# Patient Record
Sex: Female | Born: 1985 | Race: White | Hispanic: No | Marital: Married | Smoking: Never smoker
Health system: Southern US, Community
[De-identification: ages and names within clinical notes are randomized; demographics above are authoritative.]

## PROBLEM LIST (undated history)

## (undated) DIAGNOSIS — J45909 Unspecified asthma, uncomplicated: Secondary | ICD-10-CM

## (undated) DIAGNOSIS — T4145XA Adverse effect of unspecified anesthetic, initial encounter: Secondary | ICD-10-CM

## (undated) DIAGNOSIS — T8859XA Other complications of anesthesia, initial encounter: Secondary | ICD-10-CM

## (undated) HISTORY — DX: Unspecified asthma, uncomplicated: J45.909

## (undated) HISTORY — DX: Other complications of anesthesia, initial encounter: T88.59XA

## (undated) HISTORY — PX: TONSILLECTOMY: SUR1361

---
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
## 2015-12-22 DIAGNOSIS — Z8249 Family history of ischemic heart disease and other diseases of the circulatory system: Secondary | ICD-10-CM | POA: Insufficient documentation

## 2019-06-04 ENCOUNTER — Telehealth: Payer: Self-pay | Admitting: Family Medicine

## 2019-06-04 NOTE — Telephone Encounter (Signed)
Attempted to reach patient to inform her of changes made in the office. Left a voice message.

## 2019-06-06 ENCOUNTER — Ambulatory Visit: Payer: Self-pay

## 2019-06-07 NOTE — L&D Delivery Note (Addendum)
OB/GYN Faculty Practice Delivery Note  Sara Cantu is a 34 y.o. [id number] s/p VD at [redacted]w[redacted]d. She was admitted for IOL for hx of LGA infant.   ROM: 3h 76m with clear fluid GBS Status:  Negative/-- (07/09 1144) Maximum Maternal Temperature:  Temp (24hrs), Avg:98.1 F (36.7 C), Min:97.3 F (36.3 C), Max:98.5 F (36.9 C)    Labor Progress: . Patient arrived at 1.5 cm dilation and was induced with FB followed by Pitocin and AROM.   Delivery Date/Time: 01/07/2020 at 2250 Delivery: Called to room and patient was complete and pushing. Head delivered in ROA position. No nuchal cord present. Shoulder and body delivered in usual fashion. Infant with spontaneous cry, placed on mother's abdomen, dried and stimulated. Cord clamped x 2 after 1-minute delay, and cut by FOB. Cord blood drawn. Placenta delivered spontaneously with gentle cord traction. Fundus firm with massage and Pitocin. Labia, perineum, vagina, and cervix inspected with 1st degree perineal laceration repaired with 2 stitches of 3-0 Monocryl.   Placenta: 3v cord, intact Complications: None Lacerations: 1st degree perineal EBL: 66mL Analgesia: Stitched under local anesthesia   Infant: Female Infant  APGAR (1 MIN): 8   APGAR (5 MINS): 9     Weight: pending  Evelena Leyden, DO PGY-1 Family Medicine    Attestation:  I confirm that I have verified the information documented in the resident's note and that I have also personally reperformed the physical exam and all medical decision making activities.   I was gloved and present for entire delivery SVD without incident No difficulty with shoulders Lacerations as listed above Repair of same supervised by me  Aviva Signs, CNM

## 2019-06-20 ENCOUNTER — Ambulatory Visit (INDEPENDENT_AMBULATORY_CARE_PROVIDER_SITE_OTHER): Payer: BLUE CROSS/BLUE SHIELD

## 2019-06-20 ENCOUNTER — Other Ambulatory Visit: Payer: Self-pay

## 2019-06-20 ENCOUNTER — Encounter: Payer: Self-pay | Admitting: Family Medicine

## 2019-06-20 DIAGNOSIS — Z3200 Encounter for pregnancy test, result unknown: Secondary | ICD-10-CM

## 2019-06-20 DIAGNOSIS — Z3201 Encounter for pregnancy test, result positive: Secondary | ICD-10-CM

## 2019-06-20 LAB — POCT PREGNANCY, URINE: Preg Test, Ur: POSITIVE — AB

## 2019-06-20 NOTE — Progress Notes (Signed)
Pt here today for UPT; test result is positive. Results given and dating reviewed with pt. Reports first positive home pregnancy test on 05/21/19. Pt is 10w 3d today based off LMP 04/08/19, EDD 01/13/20.  Reviewed medications and allergies with pt; list of medications safe to take during pregnancy given. Pt reports some nausea, would not like to take any medication for nausea at this time. Pt expresses interest in receiving prenatal care in our office. Front office notified to schedule new OB appts and provide proof of pregnancy letter.   Fleet Contras RN 06/20/19

## 2019-06-21 NOTE — Progress Notes (Signed)
I have reviewed the chart and agree with nursing staff's documentation of this patient's encounter.  Raelyn Mora, CNM 06/21/2019 9:11 PM

## 2019-07-04 ENCOUNTER — Ambulatory Visit (INDEPENDENT_AMBULATORY_CARE_PROVIDER_SITE_OTHER): Payer: BLUE CROSS/BLUE SHIELD | Admitting: *Deleted

## 2019-07-04 ENCOUNTER — Other Ambulatory Visit: Payer: Self-pay

## 2019-07-04 DIAGNOSIS — Z3490 Encounter for supervision of normal pregnancy, unspecified, unspecified trimester: Secondary | ICD-10-CM

## 2019-07-04 NOTE — Patient Instructions (Signed)

## 2019-07-04 NOTE — Progress Notes (Signed)
I connected with  Sara Cantu on 07/04/19 at  3:30 PM EST by telephone and verified that I am speaking with the correct person using two identifiers.   I discussed the limitations, risks, security and privacy concerns of performing an evaluation and management service by telephone and the availability of in person appointments. I also discussed with the patient that there may be a patient responsible charge related to this service. The patient expressed understanding and agreed to proceed.  Explained I am completing her New OB Intake today. We discussed Her EDD and that it is based on  sure LMP . I reviewed her allergies, meds, OB History, Medical /Surgical history, and appropriate screenings. I informed her of [facility] services.  I explained I will send her the Babyscripts app- app sent to her while on phone.  I explained we will send a blood pressure cuff to [facility] once her medicaid is active. I explained  then we will have her take her blood pressure weekly and enter into the app. Explained she will have some visits in office and some virtually. I sent her a  MyChart  Text and she signed up for MyChart  app. I reviewed her new ob  appointment date/ time with her , our location and to wear mask, no visitors.  I explained she will have a pelvic exam, ob bloodwork, hemoglobin a1C, cbg , genetic testing if desired,- she  Is undecided if she  wants a panorama,  And a pap.  I scheduled an [place] at 19 weeks and gave her the appointment. She voices understanding.  Khylon Davies,RN 07/04/2019  3:29 PM

## 2019-07-08 ENCOUNTER — Ambulatory Visit (INDEPENDENT_AMBULATORY_CARE_PROVIDER_SITE_OTHER): Payer: BLUE CROSS/BLUE SHIELD | Admitting: Advanced Practice Midwife

## 2019-07-08 ENCOUNTER — Other Ambulatory Visit (HOSPITAL_COMMUNITY)
Admission: RE | Admit: 2019-07-08 | Discharge: 2019-07-08 | Disposition: A | Discharge status: Home / Self Care | Payer: BLUE CROSS/BLUE SHIELD | Source: Ambulatory Visit | Attending: Advanced Practice Midwife | Admitting: Advanced Practice Midwife

## 2019-07-08 ENCOUNTER — Encounter: Payer: Self-pay | Admitting: Advanced Practice Midwife

## 2019-07-08 ENCOUNTER — Other Ambulatory Visit: Payer: Self-pay

## 2019-07-08 ENCOUNTER — Other Ambulatory Visit: Payer: Self-pay | Admitting: Advanced Practice Midwife

## 2019-07-08 VITALS — BP 103/66 | HR 94 | Wt 164.0 lb

## 2019-07-08 DIAGNOSIS — Z3491 Encounter for supervision of normal pregnancy, unspecified, first trimester: Secondary | ICD-10-CM | POA: Diagnosis not present

## 2019-07-08 DIAGNOSIS — Z3A13 13 weeks gestation of pregnancy: Secondary | ICD-10-CM

## 2019-07-08 DIAGNOSIS — Z3490 Encounter for supervision of normal pregnancy, unspecified, unspecified trimester: Secondary | ICD-10-CM | POA: Diagnosis present

## 2019-07-08 MED ORDER — BLOOD PRESSURE KIT DEVI: 1.0000 | Freq: Every day | 0 refills | Status: DC

## 2019-07-08 NOTE — Progress Notes (Signed)
Patient seen and assessed by nursing staff during this encounter. I have reviewed the chart and agree with the documentation and plan.  Thressa Sheller DNP, CNM  07/08/19  1:05 PM

## 2019-07-08 NOTE — Progress Notes (Signed)
Subjective:   Sara Cantu is a 34 y.o. G3P2002 at [redacted]w[redacted]d by LMP being seen today for her first obstetrical visit.  Her obstetrical history is significant for large for gestational age. Patient unsure about intent to breast feed. Pregnancy history fully reviewed.  Patient reports no complaints.  HISTORY: OB History  Gravida Para Term Preterm AB Living  3 2 2  0 0 2  SAB TAB Ectopic Multiple Live Births  0 0 0 0 2    # Outcome Date GA Lbr Len/2nd Weight Sex Delivery Anes PTL Lv  3 Current           2 Term 08/04/16 [redacted]w[redacted]d  9 lb 15 oz (4.508 kg) F  None  LIV     Birth Comments: wnl  1 Term 03/13/13 [redacted]w[redacted]d  7 lb 6 oz (3.345 kg) M Vag-Spont None  LIV     Birth Comments: spotting / bleeding during pregnancy occasionally    Last pap smear was done 2018   Past Medical History:  Diagnosis Date  . Asthma    exercise induced when young  . Complication of anesthesia    woke up before surgery completed, and remedicated and numb from neck down x 2 hours   Past Surgical History:  Procedure Laterality Date  . TONSILLECTOMY     Family History  Problem Relation Age of Onset  . Asthma Mother   . Skin cancer Father   . Diabetes Mellitus I Brother    Social History   Tobacco Use  . Smoking status: Never Smoker  . Smokeless tobacco: Never Used  Substance Use Topics  . Alcohol use: Never  . Drug use: Never   Allergies  Allergen Reactions  . Shellfish Allergy Swelling    Tongue swollen with eating shrimp   Current Outpatient Medications on File Prior to Visit  Medication Sig Dispense Refill  . cetirizine (ZYRTEC) 10 MG tablet Take 1 tablet by mouth as needed.    . Nutritional Supplements (JUICE PLUS FIBRE PO) Take 4 capsules by mouth daily. Using as PNV     No current facility-administered medications on file prior to visit.    Review of Systems Pertinent items noted in HPI and remainder of comprehensive ROS otherwise negative.  Exam   Vitals:   07/08/19 1339  BP: 103/66   Pulse: 94  Weight: 164 lb (74.4 kg)   Fetal Heart Rate (bpm): 154 Physical Exam  Constitutional: She is oriented to person, place, and time and well-developed, well-nourished, and in no distress. No distress.  HENT:  Head: Normocephalic.  Cardiovascular: Normal rate.  Pulmonary/Chest: Effort normal.  Abdominal: Soft. There is no abdominal tenderness. There is no rebound.  Genitourinary:    Genitourinary Comments:  External: no lesion Vagina: small amount of white discharge Cervix: pink, smooth, closed/thick  Uterus: 13 week size    Neurological: She is oriented to person, place, and time.  Skin: Skin is warm and dry.  Psychiatric: Affect normal.  Nursing note and vitals reviewed.   Assessment:   Pregnancy: 09/05/19 Patient Active Problem List   Diagnosis Date Noted  . HX of LGA baby 07/08/2019  . Supervision of low-risk pregnancy 07/04/2019     Plan:  1. Encounter for supervision of low-risk pregnancy, antepartum - Culture, OB Urine - Genetic Screening - Obstetric Panel, Including HIV - Hemoglobin A1c - Cytology - PAP( [employer]) - Glucose, Random - 07/06/2019 MFM OB COMP + 14 WK; Future  2. HX of LGA baby - A1C and  random glucose at NOB visit     Initial labs drawn. Continue prenatal vitamins. Genetic Screening discussed, undecided about AFP test and NIPS: ordered. Ultrasound discussed; fetal anatomic survey: requested. Problem list reviewed and updated. The nature of [facility] with multiple MDs and other Advanced Practice Providers was explained to patient; also emphasized that residents, students are part of our team. Routine obstetric precautions reviewed. 50% of 45 min visit spent in counseling and coordination of care. Return in about 4 weeks (around 08/05/2019) for virtual visit .   Marcille Buffy DNP, CNM  07/08/19  2:00 PM

## 2019-07-09 LAB — OBSTETRIC PANEL, INCLUDING HIV
Antibody Screen: NEGATIVE
Basophils Absolute: 0.1 10*3/uL (ref 0.0–0.2)
Basos: 1 %
EOS (ABSOLUTE): 0.6 10*3/uL — ABNORMAL HIGH (ref 0.0–0.4)
Eos: 6 %
HIV Screen 4th Generation wRfx: NONREACTIVE
Hematocrit: 39.2 % (ref 34.0–46.6)
Hemoglobin: 12.8 g/dL (ref 11.1–15.9)
Hepatitis B Surface Ag: NEGATIVE
Immature Grans (Abs): 0 10*3/uL (ref 0.0–0.1)
Immature Granulocytes: 0 %
Lymphocytes Absolute: 1.7 10*3/uL (ref 0.7–3.1)
Lymphs: 17 %
MCH: 29.5 pg (ref 26.6–33.0)
MCHC: 32.7 g/dL (ref 31.5–35.7)
MCV: 90 fL (ref 79–97)
Monocytes Absolute: 0.6 10*3/uL (ref 0.1–0.9)
Monocytes: 6 %
Neutrophils Absolute: 7.3 10*3/uL — ABNORMAL HIGH (ref 1.4–7.0)
Neutrophils: 70 %
Platelets: 259 10*3/uL (ref 150–450)
RBC: 4.34 x10E6/uL (ref 3.77–5.28)
RDW: 12.5 % (ref 11.7–15.4)
RPR Ser Ql: NONREACTIVE
Rh Factor: POSITIVE
Rubella Antibodies, IGG: 9.85 index (ref >0.99)
WBC: 10.3 10*3/uL (ref 3.4–10.8)

## 2019-07-09 LAB — HEMOGLOBIN A1C
Est. average glucose Bld gHb Est-mCnc: 105 mg/dL
Hgb A1c MFr Bld: 5.3 % (ref 4.8–5.6)

## 2019-07-09 LAB — GLUCOSE, RANDOM: Glucose: 71 mg/dL (ref 65–99)

## 2019-07-10 LAB — URINE CULTURE, OB REFLEX

## 2019-07-10 LAB — CULTURE, OB URINE

## 2019-07-11 ENCOUNTER — Encounter: Payer: Self-pay | Admitting: *Deleted

## 2019-07-11 LAB — CYTOLOGY - PAP
Chlamydia: NEGATIVE
Comment: NEGATIVE
Comment: NEGATIVE
Comment: NORMAL
Diagnosis: NEGATIVE
High risk HPV: NEGATIVE
Neisseria Gonorrhea: NEGATIVE

## 2019-07-16 ENCOUNTER — Encounter: Payer: Self-pay | Admitting: General Practice

## 2019-07-22 ENCOUNTER — Encounter: Payer: Self-pay | Admitting: General Practice

## 2019-07-29 ENCOUNTER — Other Ambulatory Visit: Payer: Self-pay

## 2019-07-29 ENCOUNTER — Encounter: Payer: Self-pay | Admitting: Advanced Practice Midwife

## 2019-07-29 NOTE — Progress Notes (Signed)
Opened in error

## 2019-08-05 ENCOUNTER — Encounter: Payer: Self-pay | Admitting: Medical

## 2019-08-05 ENCOUNTER — Other Ambulatory Visit: Payer: Self-pay

## 2019-08-05 ENCOUNTER — Telehealth (INDEPENDENT_AMBULATORY_CARE_PROVIDER_SITE_OTHER): Payer: BLUE CROSS/BLUE SHIELD | Admitting: Medical

## 2019-08-05 DIAGNOSIS — Z3A17 17 weeks gestation of pregnancy: Secondary | ICD-10-CM

## 2019-08-05 DIAGNOSIS — Z3492 Encounter for supervision of normal pregnancy, unspecified, second trimester: Secondary | ICD-10-CM

## 2019-08-05 NOTE — Patient Instructions (Signed)

## 2019-08-05 NOTE — Progress Notes (Signed)
I connected with  [name] on 08/05/19 at  1:15 PM EST by telephone and verified that I am speaking with the correct person using two identifiers.   I discussed the limitations, risks, security and privacy concerns of performing an evaluation and management service by telephone and the availability of in person appointments. I also discussed with the patient that there may be a patient responsible charge related to this service. The patient expressed understanding and agreed to proceed.  Henrietta Dine, CMA 08/05/2019  1:14 PM

## 2019-08-05 NOTE — Progress Notes (Signed)
I connected with Sara Cantu on 08/05/19 at  1:15 PM EST by: MyChart and verified that I am speaking with the correct person using two identifiers.  Patient is located at home and provider is located at [facility].     The purpose of this virtual visit is to provide medical care while limiting exposure to the novel coronavirus. I discussed the limitations, risks, security and privacy concerns of performing an evaluation and management service by MyChart and the availability of in person appointments. I also discussed with the patient that there may be a patient responsible charge related to this service. By engaging in this virtual visit, you consent to the provision of healthcare.  Additionally, you authorize for your insurance to be billed for the services provided during this visit.  The patient expressed understanding and agreed to proceed.  The following staff members participated in the virtual visit:  Corinda Gubler, CMA    PRENATAL VISIT NOTE  Subjective:  Sara Cantu is a 34 y.o. G3P2002 at [redacted]w[redacted]d  for phone visit for ongoing prenatal care.  She is currently monitored for the following issues for this low-risk pregnancy and has Supervision of low-risk pregnancy and HX of LGA baby on their problem list.  Patient reports no complaints.  Contractions: Not present. Vag. Bleeding: None.  Movement: Present. Denies leaking of fluid.   The following portions of the patient's history were reviewed and updated as appropriate: allergies, current medications, past family history, past medical history, past social history, past surgical history and problem list.   Objective:   Vitals:   08/05/19 1317  Weight: 167 lb 8 oz (76 kg)   Self-Obtained  Fetal Status:     Movement: Present     Assessment and Plan:  Pregnancy: G3P2002 at [redacted]w[redacted]d 1. Large for gestational age newborn - Anatomy US scheduled for 08/19/19 - Consider 3rd trimester growth scan   2. Encounter for supervision of low-risk  pregnancy in second trimester - AFP ordered for after [place] on 3/15 - Normal new OB labs reviewed  - Discussed MOF, MOC and Peds - CMA to follow-up on BP cuff with [facility] for future virtual visits   Preterm labor symptoms and general obstetric precautions including but not limited to vaginal bleeding, contractions, leaking of fluid and fetal movement were reviewed in detail with the patient.  Return in about 4 weeks (around 09/02/2019) for LOB, Virtual.  Future Appointments  Date Time Provider Department Center  08/19/2019 11:00 AM WOC-WOCA LAB WOC-WOCA WOC  08/19/2019 11:30 AM WH-MFC [place] 1 WH-MFCUS MFC-US     Time spent on virtual visit: 15 minutes  Vonzella Nipple, PA-C

## 2019-08-19 ENCOUNTER — Ambulatory Visit (HOSPITAL_COMMUNITY)
Admission: RE | Admit: 2019-08-19 | Discharge: 2019-08-19 | Disposition: A | Discharge status: Home / Self Care | Payer: BLUE CROSS/BLUE SHIELD | Source: Ambulatory Visit | Attending: Obstetrics and Gynecology | Admitting: Obstetrics and Gynecology

## 2019-08-19 ENCOUNTER — Other Ambulatory Visit: Payer: Self-pay

## 2019-08-19 ENCOUNTER — Other Ambulatory Visit: Payer: BLUE CROSS/BLUE SHIELD

## 2019-08-19 DIAGNOSIS — Z363 Encounter for antenatal screening for malformations: Secondary | ICD-10-CM | POA: Diagnosis not present

## 2019-08-19 DIAGNOSIS — Z3490 Encounter for supervision of normal pregnancy, unspecified, unspecified trimester: Secondary | ICD-10-CM | POA: Diagnosis not present

## 2019-08-19 DIAGNOSIS — Z3492 Encounter for supervision of normal pregnancy, unspecified, second trimester: Secondary | ICD-10-CM

## 2019-08-19 DIAGNOSIS — Z3A19 19 weeks gestation of pregnancy: Secondary | ICD-10-CM | POA: Diagnosis not present

## 2019-08-21 LAB — AFP, SERUM, OPEN SPINA BIFIDA
AFP MoM: 0.99
AFP Value: 45.2 ng/mL
Gest. Age on Collection Date: 19 weeks
Maternal Age At EDD: 32.9 yr
OSBR Risk 1 IN: 10000
Test Results:: NEGATIVE
Weight: 172 [lb_av]

## 2019-09-02 ENCOUNTER — Telehealth (INDEPENDENT_AMBULATORY_CARE_PROVIDER_SITE_OTHER): Payer: BLUE CROSS/BLUE SHIELD | Admitting: Nurse Practitioner

## 2019-09-02 ENCOUNTER — Encounter: Payer: Self-pay | Admitting: Nurse Practitioner

## 2019-09-02 ENCOUNTER — Other Ambulatory Visit: Payer: Self-pay

## 2019-09-02 VITALS — BP 115/65 | HR 79

## 2019-09-02 DIAGNOSIS — Z3A21 21 weeks gestation of pregnancy: Secondary | ICD-10-CM

## 2019-09-02 DIAGNOSIS — Z3492 Encounter for supervision of normal pregnancy, unspecified, second trimester: Secondary | ICD-10-CM

## 2019-09-02 NOTE — Progress Notes (Signed)
I connected with@ on 09/02/19 at  1:15 PM EDT by: MyChart  and verified that I am speaking with the correct person using two identifiers.  Patient is located at [facility] provider is located at [facility].     The purpose of this virtual visit is to provide medical care while limiting exposure to the novel coronavirus. I discussed the limitations, risks, security and privacy concerns of performing an evaluation and management service by MyChart and the availability of in person appointments. I also discussed with the patient that there may be a patient responsible charge related to this service. By engaging in this virtual visit, you consent to the provision of healthcare.  Additionally, you authorize for your insurance to be billed for the services provided during this visit.  The patient expressed understanding and agreed to proceed.  The following staff members participated in the virtual visit: Marykay Lex, CMA and Nolene Bernheim, NP    PRENATAL VISIT NOTE  Subjective:  Sara Cantu is a 34 y.o. G3P2002 at [redacted]w[redacted]d  for phone visit for ongoing prenatal care.  She is currently monitored for the following issues for this low-risk pregnancy and has Supervision of low-risk pregnancy and HX of LGA baby on their problem list.  Patient reports occasional sharp stabbing pain in vagina.  Contractions: Not present. Vag. Bleeding: None.  Movement: Present. Denies leaking of fluid.   The following portions of the patient's history were reviewed and updated as appropriate: allergies, current medications, past family history, past medical history, past social history, past surgical history and problem list.   Objective:   Vitals:   09/02/19 1318  BP: 115/65  Pulse: 79   Self-Obtained  Fetal Status:     Movement: Present     Assessment and Plan:  Pregnancy: G3P2002 at [redacted]w[redacted]d 1. Large for gestational age newborn Vaginal delivery - 9 lb 15 oz  2. Encounter for supervision of low-risk pregnancy in second  trimester Doing well Declines flu shot for this season. Entering BP into Babyscripts - reviewed - all normal.  Preterm labor symptoms and general obstetric precautions including but not limited to vaginal bleeding, contractions, leaking of fluid and fetal movement were reviewed in detail with the patient.  Return in about 4 weeks (around 09/30/2019) for Virtual ROB.  No future appointments.   Time spent on virtual visit: 7 minutes  Currie Paris, NP

## 2019-09-02 NOTE — Progress Notes (Signed)
I connected with  Sara Cantu on 09/02/19 at  1:15 PM EDT by telephone and verified that I am speaking with the correct person using two identifiers.   I discussed the limitations, risks, security and privacy concerns of performing an evaluation and management service by telephone and the availability of in person appointments. I also discussed with the patient that there may be a patient responsible charge related to this service. The patient expressed understanding and agreed to proceed.  Henrietta Dine, CMA 09/02/2019  1:17 PM

## 2019-09-30 ENCOUNTER — Telehealth: Payer: BLUE CROSS/BLUE SHIELD | Admitting: Obstetrics and Gynecology

## 2019-10-02 ENCOUNTER — Telehealth (INDEPENDENT_AMBULATORY_CARE_PROVIDER_SITE_OTHER): Payer: BLUE CROSS/BLUE SHIELD | Admitting: Advanced Practice Midwife

## 2019-10-02 ENCOUNTER — Encounter: Payer: Self-pay | Admitting: Advanced Practice Midwife

## 2019-10-02 DIAGNOSIS — Z3A25 25 weeks gestation of pregnancy: Secondary | ICD-10-CM

## 2019-10-02 DIAGNOSIS — Z3492 Encounter for supervision of normal pregnancy, unspecified, second trimester: Secondary | ICD-10-CM

## 2019-10-02 DIAGNOSIS — O365920 Maternal care for other known or suspected poor fetal growth, second trimester, not applicable or unspecified: Secondary | ICD-10-CM

## 2019-10-02 NOTE — Progress Notes (Signed)
   TELEHEALTH VIRTUAL OBSTETRICS VISIT ENCOUNTER NOTE  I connected with Sara Cantu on 10/02/19 at  2:55 PM EDT by telephone at home and verified that I am speaking with the correct person using two identifiers.   I discussed the limitations, risks, security and privacy concerns of performing an evaluation and management service by telephone and the availability of in person appointments. I also discussed with the patient that there may be a patient responsible charge related to this service. The patient expressed understanding and agreed to proceed.  Subjective:  Sara Cantu is a 34 y.o. G3P2002 at [redacted]w[redacted]d being followed for ongoing prenatal care.  She is currently monitored for the following issues for this low-risk pregnancy and has Supervision of low-risk pregnancy and HX of LGA baby on their problem list.  Patient reports pubic bone pain and pain at the "top" of her abdomen . Reports fetal movement. Denies any contractions, bleeding or leaking of fluid.   The following portions of the patient's history were reviewed and updated as appropriate: allergies, current medications, past family history, past medical history, past social history, past surgical history and problem list.   Objective:   General:  Alert, oriented and cooperative.   Mental Status: Normal mood and affect perceived. Normal judgment and thought content.  Rest of physical exam deferred due to type of encounter  Assessment and Plan:  Pregnancy: G3P2002 at [redacted]w[redacted]d 1. Large for gestational age newborn - GTT at next visit   2. Encounter for supervision of low-risk pregnancy in second trimester - routine care  Preterm labor symptoms and general obstetric precautions including but not limited to vaginal bleeding, contractions, leaking of fluid and fetal movement were reviewed in detail with the patient.  I discussed the assessment and treatment plan with the patient. The patient was provided an opportunity to ask  questions and all were answered. The patient agreed with the plan and demonstrated an understanding of the instructions. The patient was advised to call back or seek an in-person office evaluation/go to [facility]U at [facility] for any urgent or concerning symptoms. Please refer to After Visit Summary for other counseling recommendations.   I provided 12 minutes of non-face-to-face time during this encounter.  Return in about 4 weeks (around 10/30/2019) for in person visit with 28 week labs and GTT .  Future Appointments  Date Time Provider Department Center  10/02/2019  2:55 PM Armando Reichert, CNM [facility] WOC  10/21/2019  8:55 AM Loralee Pacas, Odie Sera, NP [facility] WOC  10/21/2019  9:30 AM WOC-WOCA LAB WOC-WOCA WOC    Thressa Sheller DNP, CNM  10/02/19  1:02 PM  Center for [employer], [facility]

## 2019-10-02 NOTE — Progress Notes (Signed)
I connected with  Sara Cantu on 10/02/19 at  2:55 PM EDT by telephone and verified that I am speaking with the correct person using two identifiers.   I discussed the limitations, risks, security and privacy concerns of performing an evaluation and management service by telephone and the availability of in person appointments. I also discussed with the patient that there may be a patient responsible charge related to this service. The patient expressed understanding and agreed to proceed.  Sara Cantu, CMA 10/02/2019  1:01 PM   Top of belly feels bruised sometimes  Pelvic are just aches and hurts at times also.  Will take BP at home and mychart it to [place].

## 2019-10-15 ENCOUNTER — Other Ambulatory Visit: Payer: Self-pay | Admitting: *Deleted

## 2019-10-15 DIAGNOSIS — Z3490 Encounter for supervision of normal pregnancy, unspecified, unspecified trimester: Secondary | ICD-10-CM

## 2019-10-21 ENCOUNTER — Other Ambulatory Visit: Payer: Self-pay

## 2019-10-21 ENCOUNTER — Other Ambulatory Visit: Payer: BLUE CROSS/BLUE SHIELD

## 2019-10-21 ENCOUNTER — Ambulatory Visit (INDEPENDENT_AMBULATORY_CARE_PROVIDER_SITE_OTHER): Payer: BLUE CROSS/BLUE SHIELD | Admitting: Women's Health

## 2019-10-21 VITALS — BP 107/71 | HR 82 | Wt 183.7 lb

## 2019-10-21 DIAGNOSIS — Z3A28 28 weeks gestation of pregnancy: Secondary | ICD-10-CM

## 2019-10-21 DIAGNOSIS — Z23 Encounter for immunization: Secondary | ICD-10-CM

## 2019-10-21 DIAGNOSIS — Z3490 Encounter for supervision of normal pregnancy, unspecified, unspecified trimester: Secondary | ICD-10-CM

## 2019-10-21 DIAGNOSIS — Z3493 Encounter for supervision of normal pregnancy, unspecified, third trimester: Secondary | ICD-10-CM

## 2019-10-21 NOTE — Progress Notes (Signed)
Subjective:  Sara Cantu is a 34 y.o. G3P2002 at [redacted]w[redacted]d being seen today for ongoing prenatal care.  She is currently monitored for the following issues for this low-risk pregnancy and has Supervision of low-risk pregnancy and HX of LGA baby on their problem list.   Patient reports no complaints.  Contractions: Irritability. Vag. Bleeding: None.  Movement: Present. Denies leaking of fluid.   The following portions of the patient's history were reviewed and updated as appropriate: allergies, current medications, past family history, past medical history, past social history, past surgical history and problem list. Problem list updated.  Objective:   Vitals:   10/21/19 0858  BP: 107/71  Pulse: 82  Weight: 183 lb 11.2 oz (83.3 kg)    Fetal Status: Fetal Heart Rate (bpm): 141 Fundal Height: 28 cm Movement: Present     General:  Alert, oriented and cooperative. Patient is in no acute distress.  Skin: Skin is warm and dry. No rash noted.   Cardiovascular: Normal heart rate noted  Respiratory: Normal respiratory effort, no problems with respiration noted  Abdomen: Soft, gravid, appropriate for gestational age. Pain/Pressure: Present     Pelvic: Vag. Bleeding: None     Cervical exam deferred        Extremities: Normal range of motion.  Edema: None  Mental Status: Normal mood and affect. Normal behavior. Normal judgment and thought content.   Urinalysis:      Assessment and Plan:  Pregnancy: G3P2002 at [redacted]w[redacted]d  1. Encounter for supervision of low-risk pregnancy in third trimester -GTT today -Tdap today -discussed feeding plan, pt elects breastfeeding  Preterm labor symptoms and general obstetric precautions including but not limited to vaginal bleeding, contractions, leaking of fluid and fetal movement were reviewed in detail with the patient. Please refer to After Visit Summary for other counseling recommendations.  Return in about 2 weeks (around 11/04/2019) for in-person LOB/APP  OK.   Anira Senegal, Odie Sera, NP

## 2019-10-21 NOTE — Patient Instructions (Addendum)
Maternity Assessment Unit (MAU)  The Maternity Assessment Unit (MAU) is located at the [facility] at [facility]. The address is: [address], [place], [place], [place] [address]. Please see map below for additional directions.    The Maternity Assessment Unit is designed to help you during your pregnancy, and for up to 6 weeks after delivery, with any pregnancy- or postpartum-related emergencies, if you think you are in labor, or if your water has broken. For example, if you experience nausea and vomiting, vaginal bleeding, severe abdominal or pelvic pain, elevated blood pressure or other problems related to your pregnancy or postpartum time, please come to the Maternity Assessment Unit for assistance.        Glucose Tolerance Test During Pregnancy Why am I having this test? The glucose tolerance test (GTT) is done to check how your body processes sugar (glucose). This is one of several tests used to diagnose diabetes that develops during pregnancy (gestational diabetes mellitus). Gestational diabetes is a temporary form of diabetes that some women develop during pregnancy. It usually occurs during the second trimester of pregnancy and goes away after delivery. Testing (screening) for gestational diabetes usually occurs between 24 and 28 weeks of pregnancy. You may have the GTT test after having a 1-hour glucose screening test if the results from that test indicate that you may have gestational diabetes. You may also have this test if:  You have a history of gestational diabetes.  You have a history of giving birth to very large babies or have experienced repeated fetal loss (stillbirth).  You have signs and symptoms of diabetes, such as: ? Changes in your vision. ? Tingling or numbness in your hands or feet. ? Changes in hunger, thirst, and urination that are not otherwise explained by your pregnancy. What is being tested? This test measures the  amount of glucose in your blood at different times during a period of 3 hours. This indicates how well your body is able to process glucose. What kind of sample is taken?  Blood samples are required for this test. They are usually collected by inserting a needle into a blood vessel. How do I prepare for this test?  For 3 days before your test, eat normally. Have plenty of carbohydrate-rich foods.  Follow instructions from your health care provider about: ? Eating or drinking restrictions on the day of the test. You may be asked to not eat or drink anything other than water (fast) starting 8-10 hours before the test. ? Changing or stopping your regular medicines. Some medicines may interfere with this test. Tell a health care provider about:  All medicines you are taking, including vitamins, herbs, eye drops, creams, and over-the-counter medicines.  Any blood disorders you have.  Any surgeries you have had.  Any medical conditions you have. What happens during the test? First, your blood glucose will be measured. This is referred to as your fasting blood glucose, since you fasted before the test. Then, you will drink a glucose solution that contains a certain amount of glucose. Your blood glucose will be measured again 1, 2, and 3 hours after drinking the solution. This test takes about 3 hours to complete. You will need to stay at the testing location during this time. During the testing period:  Do not eat or drink anything other than the glucose solution.  Do not exercise.  Do not use any products that contain nicotine or tobacco, such as cigarettes and e-cigarettes. If  you need help stopping, ask your health care provider. The testing procedure may vary among health care providers and hospitals. How are the results reported? Your results will be reported as milligrams of glucose per deciliter of blood (mg/dL) or millimoles per liter (mmol/L). Your health care provider will compare  your results to normal ranges that were established after testing a large group of people (reference ranges). Reference ranges may vary among labs and hospitals. For this test, common reference ranges are:  Fasting: less than 95-105 mg/dL (5.3-5.8 mmol/L).  1 hour after drinking glucose: less than 180-190 mg/dL (10.0-10.5 mmol/L).  2 hours after drinking glucose: less than 155-165 mg/dL (8.6-9.2 mmol/L).  3 hours after drinking glucose: 140-145 mg/dL (7.8-8.1 mmol/L). What do the results mean? Results within reference ranges are considered normal, meaning that your glucose levels are well-controlled. If two or more of your blood glucose levels are high, you may be diagnosed with gestational diabetes. If only one level is high, your health care provider may suggest repeat testing or other tests to confirm a diagnosis. Talk with your health care provider about what your results mean. Questions to ask your health care provider Ask your health care provider, or the department that is doing the test:  When will my results be ready?  How will I get my results?  What are my treatment options?  What other tests do I need?  What are my next steps? Summary  The glucose tolerance test (GTT) is one of several tests used to diagnose diabetes that develops during pregnancy (gestational diabetes mellitus). Gestational diabetes is a temporary form of diabetes that some women develop during pregnancy.  You may have the GTT test after having a 1-hour glucose screening test if the results from that test indicate that you may have gestational diabetes. You may also have this test if you have any symptoms or risk factors for gestational diabetes.  Talk with your health care provider about what your results mean. This information is not intended to replace advice given to you by your health care provider. Make sure you discuss any questions you have with your health care provider. Document Revised:  09/13/2018 Document Reviewed: 01/02/2017 Elsevier Patient Education  [employer].        Preterm Labor and Birth Information  The normal length of a pregnancy is 39-41 weeks. Preterm labor is when labor starts before 37 completed weeks of pregnancy. What are the risk factors for preterm labor? Preterm labor is more likely to occur in women who:  Have certain infections during pregnancy such as a bladder infection, sexually transmitted infection, or infection inside the uterus (chorioamnionitis).  Have a shorter-than-normal cervix.  Have gone into preterm labor before.  Have had surgery on their cervix.  Are younger than age 65 or older than age 33.  Are African American.  Are pregnant with twins or multiple babies (multiple gestation).  Take street drugs or smoke while pregnant.  Do not gain enough weight while pregnant.  Became pregnant shortly after having been pregnant. What are the symptoms of preterm labor? Symptoms of preterm labor include:  Cramps similar to those that can happen during a menstrual period. The cramps may happen with diarrhea.  Pain in the abdomen or lower back.  Regular uterine contractions that may feel like tightening of the abdomen.  A feeling of increased pressure in the pelvis.  Increased watery or bloody mucus discharge from the vagina.  Water breaking (ruptured amniotic sac). Why is  it important to recognize signs of preterm labor? It is important to recognize signs of preterm labor because babies who are born prematurely may not be fully developed. This can put them at an increased risk for:  Long-term (chronic) heart and lung problems.  Difficulty immediately after birth with regulating body systems, including blood sugar, body temperature, heart rate, and breathing rate.  Bleeding in the brain.  Cerebral palsy.  Learning difficulties.  Death. These risks are highest for babies who are born before 34 weeks of  pregnancy. How is preterm labor treated? Treatment depends on the length of your pregnancy, your condition, and the health of your baby. It may involve:  Having a stitch (suture) placed in your cervix to prevent your cervix from opening too early (cerclage).  Taking or being given medicines, such as: ? Hormone medicines. These may be given early in pregnancy to help support the pregnancy. ? Medicine to stop contractions. ? Medicines to help mature the baby's lungs. These may be prescribed if the risk of delivery is high. ? Medicines to prevent your baby from developing cerebral palsy. If the labor happens before 34 weeks of pregnancy, you may need to stay in the hospital. What should I do if I think I am in preterm labor? If you think that you are going into preterm labor, call your health care provider right away. How can I prevent preterm labor in future pregnancies? To increase your chance of having a full-term pregnancy:  Do not use any tobacco products, such as cigarettes, chewing tobacco, and e-cigarettes. If you need help quitting, ask your health care provider.  Do not use street drugs or medicines that have not been prescribed to you during your pregnancy.  Talk with your health care provider before taking any herbal supplements, even if you have been taking them regularly.  Make sure you gain a healthy amount of weight during your pregnancy.  Watch for infection. If you think that you might have an infection, get it checked right away.  Make sure to tell your health care provider if you have gone into preterm labor before. This information is not intended to replace advice given to you by your health care provider. Make sure you discuss any questions you have with your health care provider. Document Revised: 09/14/2018 Document Reviewed: 10/14/2015 Elsevier Patient Education  2020 [employer].       https://www.cdc.gov/vaccines/hcp/vis/vis-statements/tdap.pdf">  Tdap  (Tetanus, Diphtheria, Pertussis) Vaccine: What You Need to Know 1. Why get vaccinated? Tdap vaccine can prevent tetanus, diphtheria, and pertussis. Diphtheria and pertussis spread from person to person. Tetanus enters the body through cuts or wounds.  TETANUS (T) causes painful stiffening of the muscles. Tetanus can lead to serious health problems, including being unable to open the mouth, having trouble swallowing and breathing, or death.  DIPHTHERIA (D) can lead to difficulty breathing, heart failure, paralysis, or death.  PERTUSSIS (aP), also known as "whooping cough," can cause uncontrollable, violent coughing which makes it hard to breathe, eat, or drink. Pertussis can be extremely serious in babies and young children, causing pneumonia, convulsions, brain damage, or death. In teens and adults, it can cause weight loss, loss of bladder control, passing out, and rib fractures from severe coughing. 2. Tdap vaccine Tdap is only for children 7 years and older, adolescents, and adults.  Adolescents should receive a single dose of Tdap, preferably at age 36 or 12 years. Pregnant women should get a dose of Tdap during every pregnancy, to protect  the newborn from pertussis. Infants are most at risk for severe, life-threatening complications from pertussis. Adults who have never received Tdap should get a dose of Tdap. Also, adults should receive a booster dose every 10 years, or earlier in the case of a severe and dirty wound or burn. Booster doses can be either Tdap or Td (a different vaccine that protects against tetanus and diphtheria but not pertussis). Tdap may be given at the same time as other vaccines. 3. Talk with your health care provider Tell your vaccine provider if the person getting the vaccine:  Has had an allergic reaction after a previous dose of any vaccine that protects against tetanus, diphtheria, or pertussis, or has any severe, life-threatening allergies.  Has had a coma,  decreased level of consciousness, or prolonged seizures within 7 days after a previous dose of any pertussis vaccine (DTP, DTaP, or Tdap).  Has seizures or another nervous system problem.  Has ever had Guillain-Barr Syndrome (also called GBS).  Has had severe pain or swelling after a previous dose of any vaccine that protects against tetanus or diphtheria. In some cases, your health care provider may decide to postpone Tdap vaccination to a future visit.  People with minor illnesses, such as a cold, may be vaccinated. People who are moderately or severely ill should usually wait until they recover before getting Tdap vaccine.  Your health care provider can give you more information. 4. Risks of a vaccine reaction  Pain, redness, or swelling where the shot was given, mild fever, headache, feeling tired, and nausea, vomiting, diarrhea, or stomachache sometimes happen after Tdap vaccine. People sometimes faint after medical procedures, including vaccination. Tell your provider if you feel dizzy or have vision changes or ringing in the ears.  As with any medicine, there is a very remote chance of a vaccine causing a severe allergic reaction, other serious injury, or death. 5. What if there is a serious problem? An allergic reaction could occur after the vaccinated person leaves the clinic. If you see signs of a severe allergic reaction (hives, swelling of the face and throat, difficulty breathing, a fast heartbeat, dizziness, or weakness), call 9-1-1 and get the person to the nearest hospital. For other signs that concern you, call your health care provider.  Adverse reactions should be reported to the Vaccine Adverse Event Reporting System (VAERS). Your health care provider will usually file this report, or you can do it yourself. Visit the VAERS website at www.vaers.LAgents.no or call 873-634-2178. VAERS is only for reporting reactions, and VAERS staff do not give medical advice. 6. The National  Vaccine Injury Compensation Program The [employer] Vaccine Injury Compensation Program (VICP) is a federal program that was created to compensate people who may have been injured by certain vaccines. Visit the VICP website at SpiritualWord.at or call 706-094-3499 to learn about the program and about filing a claim. There is a time limit to file a claim for compensation. 7. How can I learn more?  Ask your health care provider.  Call your local or state health department.  Contact the Centers for Disease Control and Prevention (CDC): ? Call 579-771-2003 (1-800-CDC-INFO) or ? Visit CDC's website at PicCapture.uy Vaccine Information Statement Tdap (Tetanus, Diphtheria, Pertussis) Vaccine (09/05/2018) This information is not intended to replace advice given to you by your health care provider. Make sure you discuss any questions you have with your health care provider. Document Revised: 09/14/2018 Document Reviewed: 09/17/2018 Elsevier Patient Education  2020 [employer].

## 2019-10-21 NOTE — Progress Notes (Signed)
Tdap vaccine given 28 wk packet given

## 2019-10-22 LAB — HIV ANTIBODY (ROUTINE TESTING W REFLEX): HIV Screen 4th Generation wRfx: NONREACTIVE

## 2019-10-22 LAB — GLUCOSE TOLERANCE, 2 HOURS W/ 1HR
Glucose, 1 hour: 67 mg/dL (ref 65–179)
Glucose, 2 hour: 109 mg/dL (ref 65–152)
Glucose, Fasting: 66 mg/dL (ref 65–91)

## 2019-10-22 LAB — CBC
Hematocrit: 32.6 % — ABNORMAL LOW (ref 34.0–46.6)
Hemoglobin: 10.8 g/dL — ABNORMAL LOW (ref 11.1–15.9)
MCH: 29.8 pg (ref 26.6–33.0)
MCHC: 33.1 g/dL (ref 31.5–35.7)
MCV: 90 fL (ref 79–97)
Platelets: 280 10*3/uL (ref 150–450)
RBC: 3.63 x10E6/uL — ABNORMAL LOW (ref 3.77–5.28)
RDW: 12.2 % (ref 11.7–15.4)
WBC: 12.9 10*3/uL — ABNORMAL HIGH (ref 3.4–10.8)

## 2019-10-22 LAB — RPR: RPR Ser Ql: NONREACTIVE

## 2019-10-23 ENCOUNTER — Encounter: Payer: Self-pay | Admitting: Women's Health

## 2019-10-23 ENCOUNTER — Other Ambulatory Visit: Payer: Self-pay | Admitting: Women's Health

## 2019-10-23 DIAGNOSIS — O99013 Anemia complicating pregnancy, third trimester: Secondary | ICD-10-CM

## 2019-10-23 DIAGNOSIS — O99019 Anemia complicating pregnancy, unspecified trimester: Secondary | ICD-10-CM | POA: Insufficient documentation

## 2019-10-23 MED ORDER — FERROUS SULFATE 325 (65 FE) MG PO TABS: 325.0000 mg | ORAL_TABLET | Freq: Every day | ORAL | 3 refills | Status: DC

## 2019-10-23 NOTE — Progress Notes (Signed)
RX iron.  Marylen Ponto, NP  10:08 AM 10/23/2019

## 2019-11-06 ENCOUNTER — Ambulatory Visit (INDEPENDENT_AMBULATORY_CARE_PROVIDER_SITE_OTHER): Payer: BLUE CROSS/BLUE SHIELD | Admitting: Obstetrics and Gynecology

## 2019-11-06 ENCOUNTER — Other Ambulatory Visit: Payer: Self-pay

## 2019-11-06 ENCOUNTER — Encounter: Payer: Self-pay | Admitting: Obstetrics and Gynecology

## 2019-11-06 VITALS — BP 106/66 | HR 79 | Wt 183.8 lb

## 2019-11-06 DIAGNOSIS — Z8669 Personal history of other diseases of the nervous system and sense organs: Secondary | ICD-10-CM

## 2019-11-06 DIAGNOSIS — Z3493 Encounter for supervision of normal pregnancy, unspecified, third trimester: Secondary | ICD-10-CM

## 2019-11-06 DIAGNOSIS — Z3A30 30 weeks gestation of pregnancy: Secondary | ICD-10-CM

## 2019-11-06 NOTE — Progress Notes (Signed)
Prenatal Visit Note Date: 11/06/2019 Clinic: [facility]  Subjective:  Sara Cantu is a 34 y.o. G3P2002 at [redacted]w[redacted]d being seen today for ongoing prenatal care.  She is currently monitored for the following issues for this low-risk pregnancy and has Supervision of low-risk pregnancy; HX of LGA baby; and Anemia in pregnancy on their problem list.  Patient reports no complaints.   Contractions: Irritability. Vag. Bleeding: None.  Movement: Present. Denies leaking of fluid.   The following portions of the patient's history were reviewed and updated as appropriate: allergies, current medications, past family history, past medical history, past social history, past surgical history and problem list. Problem list updated.  Objective:   Vitals:   11/06/19 0932  BP: 106/66  Pulse: 79  Weight: 183 lb 12.8 oz (83.4 kg)    Fetal Status: Fetal Heart Rate (bpm): 143 Fundal Height: 30 cm Movement: Present     General:  Alert, oriented and cooperative. Patient is in no acute distress.  Skin: Skin is warm and dry. No rash noted.   Cardiovascular: Normal heart rate noted  Respiratory: Normal respiratory effort, no problems with respiration noted  Abdomen: Soft, gravid, appropriate for gestational age. Pain/Pressure: Present     Pelvic:  Cervical exam performed Dilation: Closed Effacement (%): Thick Station: Ballotable  Extremities: Normal range of motion.  Edema: None  Mental Status: Normal mood and affect. Normal behavior. Normal judgment and thought content.   Urinalysis:      Assessment and Plan:  Pregnancy: G3P2002 at [redacted]w[redacted]d  1. Encounter for supervision of low-risk pregnancy in third trimester Having some sharp pelvic pains. Cervix 1 externally but closed internally/long/high. BC options d/w her. H/o HAs so may want to lean away from estrogen options, husband also may get vasectomy  2. HX of LGA baby Normal FH and belly feels about the same as with first child.  If normal FH, then no need for growth u/s. 2nd child macrosomic but no issues with delivery, no tears.   Preterm labor symptoms and general obstetric precautions including but not limited to vaginal bleeding, contractions, leaking of fluid and fetal movement were reviewed in detail with the patient. Please refer to After Visit Summary for other counseling recommendations.  Return for 2-3wk low risk, virtual or in person.   [place] Bing, MD

## 2019-11-27 ENCOUNTER — Encounter: Payer: Self-pay | Admitting: Obstetrics and Gynecology

## 2019-11-27 ENCOUNTER — Other Ambulatory Visit: Payer: Self-pay

## 2019-11-27 ENCOUNTER — Telehealth (INDEPENDENT_AMBULATORY_CARE_PROVIDER_SITE_OTHER): Payer: BLUE CROSS/BLUE SHIELD | Admitting: Obstetrics and Gynecology

## 2019-11-27 VITALS — BP 106/75 | HR 82

## 2019-11-27 DIAGNOSIS — Z3A33 33 weeks gestation of pregnancy: Secondary | ICD-10-CM

## 2019-11-27 DIAGNOSIS — D649 Anemia, unspecified: Secondary | ICD-10-CM

## 2019-11-27 DIAGNOSIS — Z3493 Encounter for supervision of normal pregnancy, unspecified, third trimester: Secondary | ICD-10-CM

## 2019-11-27 DIAGNOSIS — O99013 Anemia complicating pregnancy, third trimester: Secondary | ICD-10-CM

## 2019-11-27 DIAGNOSIS — O3663X0 Maternal care for excessive fetal growth, third trimester, not applicable or unspecified: Secondary | ICD-10-CM

## 2019-11-27 NOTE — Patient Instructions (Signed)

## 2019-11-27 NOTE — Progress Notes (Signed)
   MY CHART VIDEO VIRTUAL OBSTETRICS VISIT ENCOUNTER NOTE  I connected with Sara Cantu on 11/27/19 at  9:35 AM EDT by My Chart video at home and verified that I am speaking with the correct person using two identifiers. Provider located at [employer] for [employer] at [employer] [facility].   I discussed the limitations, risks, security and privacy concerns of performing an evaluation and management service by My Chart video and the availability of in person appointments. I also discussed with the patient that there may be a patient responsible charge related to this service. The patient expressed understanding and agreed to proceed.  Subjective:  Sara Cantu is a 34 y.o. G3P2002 at [redacted]w[redacted]d being followed for ongoing prenatal care.  She is currently monitored for the following issues for this low-risk pregnancy and has Supervision of low-risk pregnancy; HX of LGA baby; Anemia in pregnancy; and History of migraine on their problem list.  Patient reports episodes of light-headedness that improve with change in postion and drinking H2O. She states these episodes dont happen all of the time, but do still occur. She has also increased protein and spinach in her diet. Reports fetal movement. Denies any contractions, bleeding or leaking of fluid.   The following portions of the patient's history were reviewed and updated as appropriate: allergies, current medications, past family history, past medical history, past social history, past surgical history and problem list.   Objective:   General:  Alert, oriented and cooperative.   Mental Status: Normal mood and affect perceived. Normal judgment and thought content.  Rest of physical exam deferred due to type of encounter  BP 106/75   Pulse 82   LMP 04/08/2019  **Done by patient's own at home BP cuff and scale  Assessment and Plan:  Pregnancy: G3P2002 at [redacted]w[redacted]d  1. Anemia during pregnancy in third trimester - Advised that her lighthedness  could be a result of anemia - Will get CBC at 36 wk visit -- future order placed  2. Encounter for supervision of low-risk pregnancy in third trimester - Discussed GBS and cervical exam at next visit  Preterm labor symptoms and general obstetric precautions including but not limited to vaginal bleeding, contractions, leaking of fluid and fetal movement were reviewed in detail with the patient.  I discussed the assessment and treatment plan with the patient. The patient was provided an opportunity to ask questions and all were answered. The patient agreed with the plan and demonstrated an understanding of the instructions. The patient was advised to call back or seek an in-person office evaluation/go to [facility] at [facility] for any urgent or concerning symptoms. Please refer to After Visit Summary for other counseling recommendations.   I provided 5 minutes of non-face-to-face time during this encounter. There was 5 minutes of chart review time spent prior to this encounter. Total time spent = 10 minutes.  Return in about 3 weeks (around 12/18/2019) for Return OB w/GBS.  Future Appointments  Date Time Provider Department Center  12/13/2019 10:55 AM Rasch, Harolyn Rutherford, NP [facility]  12/20/2019  8:35 AM Marny Lowenstein, PA-C [facility]    Raelyn Mora, CNM Center for [employer], [facility]

## 2019-11-27 NOTE — Progress Notes (Signed)
I connected with  Sara Cantu on 11/27/19 at  9:35 AM EDT by telephone and verified that I am speaking with the correct person using two identifiers.   I discussed the limitations, risks, security and privacy concerns of performing an evaluation and management service by telephone and the availability of in person appointments. I also discussed with the patient that there may be a patient responsible charge related to this service. The patient expressed understanding and agreed to proceed.  Ernestina Patches, CMA 11/27/2019  9:38 AM

## 2019-12-13 ENCOUNTER — Other Ambulatory Visit: Payer: Self-pay

## 2019-12-13 ENCOUNTER — Ambulatory Visit (INDEPENDENT_AMBULATORY_CARE_PROVIDER_SITE_OTHER): Payer: BLUE CROSS/BLUE SHIELD | Admitting: Obstetrics and Gynecology

## 2019-12-13 ENCOUNTER — Inpatient Hospital Stay (HOSPITAL_COMMUNITY): Admit: 2019-12-13 | Payer: BLUE CROSS/BLUE SHIELD

## 2019-12-13 DIAGNOSIS — Z8759 Personal history of other complications of pregnancy, childbirth and the puerperium: Secondary | ICD-10-CM

## 2019-12-13 DIAGNOSIS — Z3493 Encounter for supervision of normal pregnancy, unspecified, third trimester: Secondary | ICD-10-CM

## 2019-12-13 DIAGNOSIS — Z3A35 35 weeks gestation of pregnancy: Secondary | ICD-10-CM

## 2019-12-13 NOTE — Progress Notes (Signed)
   PRENATAL VISIT NOTE  Subjective:  Sara Cantu is a 34 y.o. G3P2002 at [redacted]w[redacted]d being seen today for ongoing prenatal care.  She is currently monitored for the following issues for this low-risk pregnancy and has Supervision of low-risk pregnancy; HX of LGA baby; Anemia in pregnancy; and History of migraine on their problem list.  Patient reports no complaints.  Contractions: Irritability. Vag. Bleeding: None.  Movement: Present. Denies leaking of fluid.   The following portions of the patient's history were reviewed and updated as appropriate: allergies, current medications, past family history, past medical history, past social history, past surgical history and problem list.   Objective:   Vitals:   12/13/19 1120  BP: 109/75  Pulse: 91  Weight: 192 lb 12.8 oz (87.5 kg)    Fetal Status: Fetal Heart Rate (bpm): 146 Fundal Height: 34 cm Movement: Present     General:  Alert, oriented and cooperative. Patient is in no acute distress.  Skin: Skin is warm and dry. No rash noted.   Cardiovascular: Normal heart rate noted  Respiratory: Normal respiratory effort, no problems with respiration noted  Abdomen: Soft, gravid, appropriate for gestational age.  Pain/Pressure: Present     Pelvic: Cervical exam performed in the presence of a chaperone Dilation: Fingertip Effacement (%): Thick Station: Ballotable, -3  Extremities: Normal range of motion.  Edema: None  Mental Status: Normal mood and affect. Normal behavior. Normal judgment and thought content.   Assessment and Plan:  Pregnancy: G3P2002 at 103w4d 1. Encounter for supervision of low-risk pregnancy in third trimester  Doing well  BP normal Concerned about having another LGA baby Fundal height normal at 34 today Would consider 39 week induction if cervix is favorable and patient is agreeable Start primrose oil  Problem list update   Preterm labor symptoms and general obstetric precautions including but not limited to vaginal  bleeding, contractions, leaking of fluid and fetal movement were reviewed in detail with the patient. Please refer to After Visit Summary for other counseling recommendations.   Return in about 1 week (around 12/20/2019) for In person visits from now until delivery .  Future Appointments  Date Time Provider Department Center  12/20/2019  8:35 AM Kathlene Cote [facility]  12/27/2019  8:00 AM Rennie Plowman [facility]  01/03/2020  8:15 AM Kathlene Cote [facility]  01/10/2020  8:15 AM Barb Merino, Skipper Cliche, MD [facility]    Venia Carbon, NP

## 2019-12-13 NOTE — Patient Instructions (Signed)

## 2019-12-16 LAB — GC/CHLAMYDIA PROBE AMP (~~LOC~~) NOT AT ARMC
Chlamydia: NEGATIVE
Comment: NEGATIVE
Comment: NORMAL
Neisseria Gonorrhea: NEGATIVE

## 2019-12-17 LAB — CULTURE, BETA STREP (GROUP B ONLY): Strep Gp B Culture: NEGATIVE

## 2019-12-20 ENCOUNTER — Ambulatory Visit (INDEPENDENT_AMBULATORY_CARE_PROVIDER_SITE_OTHER): Payer: BLUE CROSS/BLUE SHIELD | Admitting: Medical

## 2019-12-20 ENCOUNTER — Other Ambulatory Visit: Payer: Self-pay

## 2019-12-20 VITALS — BP 105/72 | HR 97 | Wt 194.0 lb

## 2019-12-20 DIAGNOSIS — Z8759 Personal history of other complications of pregnancy, childbirth and the puerperium: Secondary | ICD-10-CM

## 2019-12-20 DIAGNOSIS — Z3A36 36 weeks gestation of pregnancy: Secondary | ICD-10-CM

## 2019-12-20 DIAGNOSIS — O99013 Anemia complicating pregnancy, third trimester: Secondary | ICD-10-CM

## 2019-12-20 DIAGNOSIS — Z3493 Encounter for supervision of normal pregnancy, unspecified, third trimester: Secondary | ICD-10-CM

## 2019-12-20 DIAGNOSIS — D649 Anemia, unspecified: Secondary | ICD-10-CM

## 2019-12-20 LAB — CBC
Hematocrit: 34.7 % (ref 34.0–46.6)
Hemoglobin: 11.3 g/dL (ref 11.1–15.9)
MCH: 28.9 pg (ref 26.6–33.0)
MCHC: 32.6 g/dL (ref 31.5–35.7)
MCV: 89 fL (ref 79–97)
Platelets: 218 x10E3/uL (ref 150–450)
RBC: 3.91 x10E6/uL (ref 3.77–5.28)
RDW: 13.7 % (ref 11.7–15.4)
WBC: 12.2 x10E3/uL — ABNORMAL HIGH (ref 3.4–10.8)

## 2019-12-20 NOTE — Patient Instructions (Signed)
Fetal Movement Counts Patient Name: ________________________________________________ Patient Due Date: ____________________ What is a fetal movement count?  A fetal movement count is the number of times that you feel your baby move during a certain amount of time. This may also be called a fetal kick count. A fetal movement count is recommended for every pregnant woman. You may be asked to start counting fetal movements as early as week 28 of your pregnancy. Pay attention to when your baby is most active. You may notice your baby's sleep and wake cycles. You may also notice things that make your baby move more. You should do a fetal movement count:  When your baby is normally most active.  At the same time each day. A good time to count movements is while you are resting, after having something to eat and drink. How do I count fetal movements? 1. Find a quiet, comfortable area. Sit, or lie down on your side. 2. Write down the date, the start time and stop time, and the number of movements that you felt between those two times. Take this information with you to your health care visits. 3. Write down your start time when you feel the first movement. 4. Count kicks, flutters, swishes, rolls, and jabs. You should feel at least 10 movements. 5. You may stop counting after you have felt 10 movements, or if you have been counting for 2 hours. Write down the stop time. 6. If you do not feel 10 movements in 2 hours, contact your health care provider for further instructions. Your health care provider may want to do additional tests to assess your baby's well-being. Contact a health care provider if:  You feel fewer than 10 movements in 2 hours.  Your baby is not moving like he or she usually does. Date: ____________ Start time: ____________ Stop time: ____________ Movements: ____________ Date: ____________ Start time: ____________ Stop time: ____________ Movements: ____________ Date: ____________  Start time: ____________ Stop time: ____________ Movements: ____________ Date: ____________ Start time: ____________ Stop time: ____________ Movements: ____________ Date: ____________ Start time: ____________ Stop time: ____________ Movements: ____________ Date: ____________ Start time: ____________ Stop time: ____________ Movements: ____________ Date: ____________ Start time: ____________ Stop time: ____________ Movements: ____________ Date: ____________ Start time: ____________ Stop time: ____________ Movements: ____________ Date: ____________ Start time: ____________ Stop time: ____________ Movements: ____________ This information is not intended to replace advice given to you by your health care provider. Make sure you discuss any questions you have with your health care provider. Document Revised: 01/10/2019 Document Reviewed: 01/10/2019 Elsevier Patient Education  2020 Elsevier Inc. Braxton Hicks Contractions Contractions of the uterus can occur throughout pregnancy, but they are not always a sign that you are in labor. You may have practice contractions called Braxton Hicks contractions. These false labor contractions are sometimes confused with true labor. What are Braxton Hicks contractions? Braxton Hicks contractions are tightening movements that occur in the muscles of the uterus before labor. Unlike true labor contractions, these contractions do not result in opening (dilation) and thinning of the cervix. Toward the end of pregnancy (32-34 weeks), Braxton Hicks contractions can happen more often and may become stronger. These contractions are sometimes difficult to tell apart from true labor because they can be very uncomfortable. You should not feel embarrassed if you go to the hospital with false labor. Sometimes, the only way to tell if you are in true labor is for your health care provider to look for changes in the cervix. The health care provider   will do a physical exam and may  monitor your contractions. If you are not in true labor, the exam should show that your cervix is not dilating and your water has not broken. If there are no other health problems associated with your pregnancy, it is completely safe for you to be sent home with false labor. You may continue to have Braxton Hicks contractions until you go into true labor. How to tell the difference between true labor and false labor True labor  Contractions last 30-70 seconds.  Contractions become very regular.  Discomfort is usually felt in the top of the uterus, and it spreads to the lower abdomen and low back.  Contractions do not go away with walking.  Contractions usually become more intense and increase in frequency.  The cervix dilates and gets thinner. False labor  Contractions are usually shorter and not as strong as true labor contractions.  Contractions are usually irregular.  Contractions are often felt in the front of the lower abdomen and in the groin.  Contractions may go away when you walk around or change positions while lying down.  Contractions get weaker and are shorter-lasting as time goes on.  The cervix usually does not dilate or become thin. Follow these instructions at home:   Take over-the-counter and prescription medicines only as told by your health care provider.  Keep up with your usual exercises and follow other instructions from your health care provider.  Eat and drink lightly if you think you are going into labor.  If Braxton Hicks contractions are making you uncomfortable: ? Change your position from lying down or resting to walking, or change from walking to resting. ? Sit and rest in a tub of warm water. ? Drink enough fluid to keep your urine pale yellow. Dehydration may cause these contractions. ? Do slow and deep breathing several times an hour.  Keep all follow-up prenatal visits as told by your health care provider. This is important. Contact a  health care provider if:  You have a fever.  You have continuous pain in your abdomen. Get help right away if:  Your contractions become stronger, more regular, and closer together.  You have fluid leaking or gushing from your vagina.  You pass blood-tinged mucus (bloody show).  You have bleeding from your vagina.  You have low back pain that you never had before.  You feel your baby's head pushing down and causing pelvic pressure.  Your baby is not moving inside you as much as it used to. Summary  Contractions that occur before labor are called Braxton Hicks contractions, false labor, or practice contractions.  Braxton Hicks contractions are usually shorter, weaker, farther apart, and less regular than true labor contractions. True labor contractions usually become progressively stronger and regular, and they become more frequent.  Manage discomfort from Braxton Hicks contractions by changing position, resting in a warm bath, drinking plenty of water, or practicing deep breathing. This information is not intended to replace advice given to you by your health care provider. Make sure you discuss any questions you have with your health care provider. Document Revised: 05/05/2017 Document Reviewed: 10/06/2016 Elsevier Patient Education  2020 Elsevier Inc.  

## 2019-12-20 NOTE — Progress Notes (Signed)
   PRENATAL VISIT NOTE  Subjective:  Sara Cantu is a 34 y.o. G3P2002 at [redacted]w[redacted]d being seen today for ongoing prenatal care.  She is currently monitored for the following issues for this low-risk pregnancy and has Supervision of low-risk pregnancy; HX of LGA baby; Anemia in pregnancy; and History of migraine on their problem list.  Patient reports occasional contractions.  Contractions: Irritability. Vag. Bleeding: None.  Movement: Present. Denies leaking of fluid.   The following portions of the patient's history were reviewed and updated as appropriate: allergies, current medications, past family history, past medical history, past social history, past surgical history and problem list.   Objective:   Vitals:   12/20/19 0845  BP: 105/72  Pulse: 97  Weight: 194 lb (88 kg)    Fetal Status: Fetal Heart Rate (bpm): 135   Movement: Present     General:  Alert, oriented and cooperative. Patient is in no acute distress.  Skin: Skin is warm and dry. No rash noted.   Cardiovascular: Normal heart rate noted  Respiratory: Normal respiratory effort, no problems with respiration noted  Abdomen: Soft, gravid, appropriate for gestational age.  Pain/Pressure: Present     Pelvic: Cervical exam performed in the presence of a chaperone Dilation: 1 Effacement (%): 20 Station: Ballotable  Extremities: Normal range of motion.  Edema: None  Mental Status: Normal mood and affect. Normal behavior. Normal judgment and thought content.   Assessment and Plan:  Pregnancy: G3P2002 at [redacted]w[redacted]d 1. Encounter for supervision of low-risk pregnancy in third trimester - Doing well - GBS negative at last visit   2. HX of LGA baby - FH normal today   3. Anemia during pregnancy in third trimester - CBC today  - Still taking iron supplement   Preterm labor symptoms and general obstetric precautions including but not limited to vaginal bleeding, contractions, leaking of fluid and fetal movement were reviewed in  detail with the patient. Please refer to After Visit Summary for other counseling recommendations.   Return in about 1 week (around 12/27/2019) for LOB, any provider, In-Person.  Future Appointments  Date Time Provider Department Center  12/27/2019  8:00 AM Rennie Plowman [facility]  01/03/2020  8:15 AM Kathlene Cote [facility]  01/10/2020  8:15 AM Barb Merino, Skipper Cliche, MD [facility]    Vonzella Nipple, PA-C

## 2019-12-27 ENCOUNTER — Ambulatory Visit (INDEPENDENT_AMBULATORY_CARE_PROVIDER_SITE_OTHER): Payer: BLUE CROSS/BLUE SHIELD

## 2019-12-27 ENCOUNTER — Other Ambulatory Visit: Payer: Self-pay

## 2019-12-27 VITALS — BP 98/61 | HR 84 | Wt 196.7 lb

## 2019-12-27 DIAGNOSIS — O99013 Anemia complicating pregnancy, third trimester: Secondary | ICD-10-CM

## 2019-12-27 DIAGNOSIS — Z8759 Personal history of other complications of pregnancy, childbirth and the puerperium: Secondary | ICD-10-CM

## 2019-12-27 DIAGNOSIS — Z3A37 37 weeks gestation of pregnancy: Secondary | ICD-10-CM

## 2019-12-27 DIAGNOSIS — D649 Anemia, unspecified: Secondary | ICD-10-CM

## 2019-12-27 DIAGNOSIS — Z3493 Encounter for supervision of normal pregnancy, unspecified, third trimester: Secondary | ICD-10-CM

## 2019-12-27 NOTE — Progress Notes (Signed)
   PRENATAL VISIT NOTE  Subjective:  Sara Cantu is a 34 y.o. G3P2002 at [redacted]w[redacted]d being seen today for ongoing prenatal care.  She is currently monitored for the following issues for this high-risk pregnancy and has Supervision of low-risk pregnancy; HX of LGA baby; Anemia in pregnancy; and History of migraine on their problem list.  Patient reports no complaints.  Contractions: Irritability. Vag. Bleeding: None.  Movement: Present. Denies leaking of fluid.   The following portions of the patient's history were reviewed and updated as appropriate: allergies, current medications, past family history, past medical history, past social history, past surgical history and problem list.   Objective:   Vitals:   12/27/19 0820  BP: (!) 98/61  Pulse: 84  Weight: 196 lb 11.2 oz (89.2 kg)    Fetal Status: Fetal Heart Rate (bpm): 141 Fundal Height: 37 cm Movement: Present     General:  Alert, oriented and cooperative. Patient is in no acute distress.  Skin: Skin is warm and dry. No rash noted.   Cardiovascular: Normal heart rate noted  Respiratory: Normal respiratory effort, no problems with respiration noted  Abdomen: Soft, gravid, appropriate for gestational age.  Pain/Pressure: Absent     Pelvic: Cervical exam performed in the presence of a chaperone Dilation: 1 Effacement (%): Thick Station: Ballotable  Extremities: Normal range of motion.  Edema: None  Mental Status: Normal mood and affect. Normal behavior. Normal judgment and thought content.   Assessment and Plan:  Pregnancy: G3P2002 at [redacted]w[redacted]d 1. Encounter for supervision of low-risk pregnancy in third trimester -No complaints. Routine care -2 previous unmedicated deliveries, desires third. Reviewed option for nitrous as lower intervention pain relief if needed -Patient reports laboring after membrane sweep with last pregnancy. Desires again. Discussed doing this at 39 week appointment and reasons why we don't do it before then.   2.  HX of LGA baby -FH normal. Patient reports although this baby is feeling bigger this week, it still feels smaller than last baby.  3. Anemia during pregnancy in third trimester -Hemoglobin 11.3 last week.   Term labor symptoms and general obstetric precautions including but not limited to vaginal bleeding, contractions, leaking of fluid and fetal movement were reviewed in detail with the patient. Please refer to After Visit Summary for other counseling recommendations.   Return in about 1 week (around 01/03/2020) for Return OB visit.  Future Appointments  Date Time Provider Department Center  01/02/2020 10:35 AM Rasch, Harolyn Rutherford, NP [facility]  01/15/2020  8:15 AM [facility]  01/15/2020  9:35 AM Marny Lowenstein, PA-C [facility]    Rolm Bookbinder, [place]  12/27/19 8:50 AM

## 2019-12-27 NOTE — Patient Instructions (Signed)
Safe Medications in Pregnancy   Acne:  Benzoyl Peroxide  Salicylic Acid   Backache/Headache:  Tylenol: 2 regular strength every 4 hours OR        2 Extra strength every 6 hours   Colds/Coughs/Allergies:  Benadryl (alcohol free) 25 mg every 6 hours as needed  Breath right strips  Claritin  Cepacol throat lozenges  Chloraseptic throat spray  Cold-Eeze- up to three times per day  Cough drops, alcohol free  Flonase (by prescription only)  Guaifenesin  Mucinex  Robitussin DM (plain only, alcohol free)  Saline nasal spray/drops  Sudafed (pseudoephedrine) & Actifed * use only after [redacted] weeks gestation and if you do not have high blood pressure  Tylenol  Vicks Vaporub  Zinc lozenges  Zyrtec   Constipation:  Colace  Ducolax suppositories  Fleet enema  Glycerin suppositories  Metamucil  Milk of magnesia  Miralax  Senokot  Smooth move tea   Diarrhea:  Kaopectate  Imodium A-D   *NO pepto Bismol   Hemorrhoids:  Anusol  Anusol HC  Preparation H  Tucks   Indigestion:  Tums  Maalox  Mylanta  Zantac  Pepcid   Insomnia:  Benadryl (alcohol free) 25mg every 6 hours as needed  Tylenol PM  Unisom, no Gelcaps   Leg Cramps:  Tums  MagGel   Nausea/Vomiting:  Bonine  Dramamine  Emetrol  Ginger extract  Sea bands  Meclizine  Nausea medication to take during pregnancy:  Unisom (doxylamine succinate 25 mg tablets) Take one tablet daily at bedtime. If symptoms are not adequately controlled, the dose can be increased to a maximum recommended dose of two tablets daily (1/2 tablet in the morning, 1/2 tablet mid-afternoon and one at bedtime).  Vitamin B6 100mg tablets. Take one tablet twice a day (up to 200 mg per day).   Skin Rashes:  Aveeno products  Benadryl cream or 25mg every 6 hours as needed  Calamine Lotion  1% cortisone cream   Yeast infection:  Gyne-lotrimin 7  Monistat 7    **If taking multiple medications, please check labels to avoid  duplicating the same active ingredients  **take medication as directed on the label  ** Do not exceed 4000 mg of tylenol in 24 hours  **Do not take medications that contain aspirin or ibuprofen          Fetal Movement Counts Patient Name: ________________________________________________ Patient Due Date: ____________________ What is a fetal movement count?  A fetal movement count is the number of times that you feel your baby move during a certain amount of time. This may also be called a fetal kick count. A fetal movement count is recommended for every pregnant woman. You may be asked to start counting fetal movements as early as week 28 of your pregnancy. Pay attention to when your baby is most active. You may notice your baby's sleep and wake cycles. You may also notice things that make your baby move more. You should do a fetal movement count:  When your baby is normally most active.  At the same time each day. A good time to count movements is while you are resting, after having something to eat and drink. How do I count fetal movements? 1. Find a quiet, comfortable area. Sit, or lie down on your side. 2. Write down the date, the start time and stop time, and the number of movements that you felt between those two times. Take this information with you to your health care visits. 3. Write down   care provider may want to do additional tests to assess your baby's well-being. Contact a health care provider if:  You feel fewer than 10 movements in 2 hours.  Your baby is not moving like he or she usually does. Date: ____________ Start time: ____________ Stop time: ____________ Movements:  ____________ Date: ____________ Start time: ____________ Stop time: ____________ Movements: ____________ Date: ____________ Start time: ____________ Stop time: ____________ Movements: ____________ Date: ____________ Start time: ____________ Stop time: ____________ Movements: ____________ Date: ____________ Start time: ____________ Stop time: ____________ Movements: ____________ Date: ____________ Start time: ____________ Stop time: ____________ Movements: ____________ Date: ____________ Start time: ____________ Stop time: ____________ Movements: ____________ Date: ____________ Start time: ____________ Stop time: ____________ Movements: ____________ Date: ____________ Start time: ____________ Stop time: ____________ Movements: ____________ This information is not intended to replace advice given to you by your health care provider. Make sure you discuss any questions you have with your health care provider. Document Revised: 01/10/2019 Document Reviewed: 01/10/2019 Elsevier Patient Education  2020 Elsevier Inc.  [employer] of the uterus can occur throughout pregnancy, but they are not always a sign that you are in labor. You may have practice contractions called Braxton Hicks contractions. These false labor contractions are sometimes confused with true labor. What are Sara Cantu contractions? Braxton Hicks contractions are tightening movements that occur in the muscles of the uterus before labor. Unlike true labor contractions, these contractions do not result in opening (dilation) and thinning of the cervix. Toward the end of pregnancy (32-34 weeks), Braxton Hicks contractions can happen more often and may become stronger. These contractions are sometimes difficult to tell apart from true labor because they can be very uncomfortable. You should not feel embarrassed if you go to the hospital with false labor. Sometimes, the only way to tell if you are in true labor is  for your health care provider to look for changes in the cervix. The health care provider will do a physical exam and may monitor your contractions. If you are not in true labor, the exam should show that your cervix is not dilating and your water has not broken. If there are no other health problems associated with your pregnancy, it is completely safe for you to be sent home with false labor. You may continue to have Braxton Hicks contractions until you go into true labor. How to tell the difference between true labor and false labor True labor  Contractions last 30-70 seconds.  Contractions become very regular.  Discomfort is usually felt in the top of the uterus, and it spreads to the lower abdomen and low back.  Contractions do not go away with walking.  Contractions usually become more intense and increase in frequency.  The cervix dilates and gets thinner. False labor  Contractions are usually shorter and not as strong as true labor contractions.  Contractions are usually irregular.  Contractions are often felt in the front of the lower abdomen and in the groin.  Contractions may go away when you walk around or change positions while lying down.  Contractions get weaker and are shorter-lasting as time goes on.  The cervix usually does not dilate or become thin. Follow these instructions at home:   Take over-the-counter and prescription medicines only as told by your health care provider.  Keep up with your usual exercises and follow other instructions from your health care provider.  Eat and drink lightly if you think you are going into labor.  If [employer] contractions  are shorter-lasting as time goes on.  The cervix usually does not dilate or become thin. Follow these instructions at home:   Take over-the-counter and prescription medicines only as told by your health care provider.  Keep up with your usual exercises and follow other instructions from your health care provider.  Eat and drink lightly if you think you are going into labor.  If Braxton Hicks contractions are making you uncomfortable: ? Change your position from lying down or resting to walking, or change from walking to resting. ? Sit and rest in a tub of warm water. ? Drink enough fluid to keep your urine pale  yellow. Dehydration may cause these contractions. ? Do slow and deep breathing several times an hour.  Keep all follow-up prenatal visits as told by your health care provider. This is important. Contact a health care provider if:  You have a fever.  You have continuous pain in your abdomen. Get help right away if:  Your contractions become stronger, more regular, and closer together.  You have fluid leaking or gushing from your vagina.  You pass blood-tinged mucus (bloody show).  You have bleeding from your vagina.  You have low back pain that you never had before.  You feel your baby's head pushing down and causing pelvic pressure.  Your baby is not moving inside you as much as it used to. Summary  Contractions that occur before labor are called Braxton Hicks contractions, false labor, or practice contractions.  Braxton Hicks contractions are usually shorter, weaker, farther apart, and less regular than true labor contractions. True labor contractions usually become progressively stronger and regular, and they become more frequent.  Manage discomfort from Braxton Hicks contractions by changing position, resting in a warm bath, drinking plenty of water, or practicing deep breathing. This information is not intended to replace advice given to you by your health care provider. Make sure you discuss any questions you have with your health care provider. Document Revised: 05/05/2017 Document Reviewed: 10/06/2016 Elsevier Patient Education  2020 Elsevier Inc.  

## 2020-01-02 ENCOUNTER — Telehealth (HOSPITAL_COMMUNITY): Payer: Self-pay | Admitting: *Deleted

## 2020-01-02 ENCOUNTER — Encounter (HOSPITAL_COMMUNITY): Payer: Self-pay | Admitting: *Deleted

## 2020-01-02 ENCOUNTER — Ambulatory Visit (INDEPENDENT_AMBULATORY_CARE_PROVIDER_SITE_OTHER): Payer: BLUE CROSS/BLUE SHIELD | Admitting: Obstetrics and Gynecology

## 2020-01-02 ENCOUNTER — Other Ambulatory Visit: Payer: Self-pay

## 2020-01-02 VITALS — BP 103/71 | HR 101 | Wt 198.0 lb

## 2020-01-02 DIAGNOSIS — Z3493 Encounter for supervision of normal pregnancy, unspecified, third trimester: Secondary | ICD-10-CM

## 2020-01-02 NOTE — Telephone Encounter (Signed)
Preadmission screen  

## 2020-01-02 NOTE — Progress Notes (Signed)
PRENATAL VISIT NOTE  Subjective:  Sara Cantu is a 34 y.o. G3P2002 at [redacted]w[redacted]d being seen today for ongoing prenatal care.  She is currently monitored for the following issues for this low-risk pregnancy and has Supervision of low-risk pregnancy; HX of LGA baby; Anemia in pregnancy; and History of migraine on their problem list.  Patient reports occasional contractions.  Contractions: Irritability. Vag. Bleeding: None.  Movement: Present. Denies leaking of fluid. Denies vaginally bleeding. Denies low back pain.   History of LGA baby with previous pregnancy.   The following portions of the patient's history were reviewed and updated as appropriate: allergies, current medications, past family history, past medical history, past social history, past surgical history and problem list. Problem list updated.  Objective:   Vitals:   01/02/20 1100  BP: 103/71  Pulse: 101  Weight: 198 lb (89.8 kg)    Fetal Status: Fetal Heart Rate (bpm): 148   Movement: Present     General:  Alert, oriented and cooperative. Patient is in no acute distress.  Skin: Skin is warm and dry. No rash noted.   Cardiovascular: Normal heart rate noted  Respiratory: Normal respiratory effort, no problems with respiration noted  Abdomen: Soft, gravid, appropriate for gestational age.  Pain/Pressure: Present     Pelvic: Cervical exam performed        Extremities: Normal range of motion.  Edema: None  Mental Status:  Normal mood and affect. Normal behavior. Normal judgment and thought content.   Assessment and Plan:  Pregnancy: G3P2002 at [redacted]w[redacted]d  1. Encounter for supervision of low-risk pregnancy in third trimester No signs of regular contractions at this time.  Cervix is dilated to 1-1.5cm at this time.  BP good today.  Patient asking to discuss induction @ 39 weeks due to history of LGA baby- she is concerned about having another LGA baby. Fundal height today 40. Cervix is favorable. Discussed foley bulb vs.  cytotec for cervical ripening. Patient would like to avoid medication, if possible. Also discussed the option of in-hospital foley bulb insertion with admission 2345 Sunday night vs. In office foley insertion and admission Monday night. Patient elected for in hospital admission Sunday night for foley-bulb insertion and induction of pregnancy however no available induction spots, will add her to Monday night @ 1145 PM. She is unsure about coming in for outpatient foley placement, and will notify the office if she decides on this method.   2. HX of LGA baby History of 9lb 15oz baby. Measuring 39-40cm today. Plan for induction @ 39 weeks.    Term labor symptoms and general obstetric precautions including but not limited to vaginal bleeding, contractions, leaking of fluid and fetal movement were reviewed in detail with the patient. Please refer to After Visit Summary for other counseling recommendations.  Return in about 4 weeks (around 01/30/2020) for Please schedule anatomy scan [place]. F/u for LR APP.   Tollie Eth, NP   Attestation of Supervision of Student:  I confirm that I have verified the information documented in the nurse practitioner fellow's note and that I have also personally performed the history, physical exam and all medical decision making activities.  I have verified that all services and findings are accurately documented in this student's note; and I agree with management and plan as outlined in the documentation. I have also made any necessary editorial changes.  (add any additional history, PE or MDM you re-performed)  Venia Carbon, NP [facility] [employer], [facility]  01/02/2020 1:54 PM

## 2020-01-02 NOTE — Patient Instructions (Addendum)
Second Trimester of Pregnancy  The second trimester is from week 14 through week 27 (month 4 through 6). This is often the time in pregnancy that you feel your best. Often times, morning sickness has lessened or quit. You may have more energy, and you may get hungry more often. Your unborn baby is growing rapidly. At the end of the sixth month, he or she is about 9 inches long and weighs about 1 pounds. You will likely feel the baby move between 18 and 20 weeks of pregnancy. Follow these instructions at home: Medicines  Take over-the-counter and prescription medicines only as told by your doctor. Some medicines are safe and some medicines are not safe during pregnancy.  Take a prenatal vitamin that contains at least 600 micrograms (mcg) of folic acid.  If you have trouble pooping (constipation), take medicine that will make your stool soft (stool softener) if your doctor approves. Eating and drinking   Eat regular, healthy meals.  Avoid raw meat and uncooked cheese.  If you get low calcium from the food you eat, talk to your doctor about taking a daily calcium supplement.  Avoid foods that are high in fat and sugars, such as fried and sweet foods.  If you feel sick to your stomach (nauseous) or throw up (vomit): ? Eat 4 or 5 small meals a day instead of 3 large meals. ? Try eating a few soda crackers. ? Drink liquids between meals instead of during meals.  To prevent constipation: ? Eat foods that are high in fiber, like fresh fruits and vegetables, whole grains, and beans. ? Drink enough fluids to keep your pee (urine) clear or pale yellow. Activity  Exercise only as told by your doctor. Stop exercising if you start to have cramps.  Do not exercise if it is too hot, too humid, or if you are in a place of great height (high altitude).  Avoid heavy lifting.  Wear low-heeled shoes. Sit and stand up straight.  You can continue to have sex unless your doctor tells you not  to. Relieving pain and discomfort  Wear a good support bra if your breasts are tender.  Take warm water baths (sitz baths) to soothe pain or discomfort caused by hemorrhoids. Use hemorrhoid cream if your doctor approves.  Rest with your legs raised if you have leg cramps or low back pain.  If you develop puffy, bulging veins (varicose veins) in your legs: ? Wear support hose or compression stockings as told by your doctor. ? Raise (elevate) your feet for 15 minutes, 3-4 times a day. ? Limit salt in your food. Prenatal care  Write down your questions. Take them to your prenatal visits.  Keep all your prenatal visits as told by your doctor. This is important. Safety  Wear your seat belt when driving.  Make a list of emergency phone numbers, including numbers for family, friends, the hospital, and police and fire departments. General instructions  Ask your doctor about the right foods to eat or for help finding a counselor, if you need these services.  Ask your doctor about local prenatal classes. Begin classes before month 6 of your pregnancy.  Do not use hot tubs, steam rooms, or saunas.  Do not douche or use tampons or scented sanitary pads.  Do not cross your legs for long periods of time.  Visit your dentist if you have not done so. Use a soft toothbrush to brush your teeth. Floss gently.  Avoid all smoking, herbs,   and alcohol. Avoid drugs that are not approved by your doctor.  Do not use any products that contain nicotine or tobacco, such as cigarettes and e-cigarettes. If you need help quitting, ask your doctor.  Avoid cat litter boxes and soil used by cats. These carry germs that can cause birth defects in the baby and can cause a loss of your baby (miscarriage) or stillbirth. Contact a doctor if:  You have mild cramps or pressure in your lower belly.  You have pain when you pee (urinate).  You have bad smelling fluid coming from your vagina.  You continue to  feel sick to your stomach (nauseous), throw up (vomit), or have watery poop (diarrhea).  You have a nagging pain in your belly area.  You feel dizzy. Get help right away if:  You have a fever.  You are leaking fluid from your vagina.  You have spotting or bleeding from your vagina.  You have severe belly cramping or pain.  You lose or gain weight rapidly.  You have trouble catching your breath and have chest pain.  You notice sudden or extreme puffiness (swelling) of your face, hands, ankles, feet, or legs.  You have not felt the baby move in over an hour.  You have severe headaches that do not go away when you take medicine.  You have trouble seeing. Summary  The second trimester is from week 14 through week 27 (months 4 through 6). This is often the time in pregnancy that you feel your best.  To take care of yourself and your unborn baby, you will need to eat healthy meals, take medicines only if your doctor tells you to do so, and do activities that are safe for you and your baby.  Call your doctor if you get sick or if you notice anything unusual about your pregnancy. Also, call your doctor if you need help with the right food to eat, or if you want to know what activities are safe for you. This information is not intended to replace advice given to you by your health care provider. Make sure you discuss any questions you have with your health care provider. Document Revised: 09/14/2018 Document Reviewed: 06/28/2016 Elsevier Patient Education  2020 [employer]vier Inc[employer]  OUTPATIENT FOLEY BULB INDUCTION OF LABOR:  Information Sheet for Mothers and Family               What's a Foley Bulb Induction? A Foley bulb induction is a procedure where your provider inserts a catheter into your cervix. Once inside your womb, your provider inflates the balloon with a saline solution.   This puts pressure on your cervix and encourages dilation. The catheter falls out once your cervix  dilates to 3-4 centimeters.     With any procedure, it's important that you know what to expect. The insertion of a Foley catheter can be a bit uncomfortable, and some women experience sharp pelvic pain. The pain may subside once the catheter is in place. You may experience some cramping when the Foley catheter is in place.  This is normal.     GO TO THE MATERNITY ADMISSIONS UNIT FOR THE FOLLOWING:  Heavy vaginal bleeding  Rupture of membranes (fluid that wets your underwear)  Painful uterine contractions every 5 minutes or less  Severe abdominal discomfort  Decreased movement of the baby

## 2020-01-03 ENCOUNTER — Other Ambulatory Visit: Payer: Self-pay | Admitting: Advanced Practice Midwife

## 2020-01-03 ENCOUNTER — Encounter: Payer: BLUE CROSS/BLUE SHIELD | Admitting: Medical

## 2020-01-06 ENCOUNTER — Other Ambulatory Visit (HOSPITAL_COMMUNITY)
Admission: RE | Admit: 2020-01-06 | Discharge: 2020-01-06 | Disposition: A | Discharge status: Home / Self Care | Payer: BLUE CROSS/BLUE SHIELD | Source: Ambulatory Visit | Attending: Family Medicine | Admitting: Family Medicine

## 2020-01-06 DIAGNOSIS — Z01812 Encounter for preprocedural laboratory examination: Secondary | ICD-10-CM | POA: Insufficient documentation

## 2020-01-06 DIAGNOSIS — Z20822 Contact with and (suspected) exposure to covid-19: Secondary | ICD-10-CM | POA: Insufficient documentation

## 2020-01-06 LAB — SARS CORONAVIRUS 2 (TAT 6-24 HRS): SARS Coronavirus 2: NEGATIVE

## 2020-01-07 ENCOUNTER — Encounter (HOSPITAL_COMMUNITY): Payer: Self-pay | Admitting: Obstetrics and Gynecology

## 2020-01-07 ENCOUNTER — Encounter (HOSPITAL_COMMUNITY): Payer: Self-pay | Admitting: Anesthesiology

## 2020-01-07 ENCOUNTER — Inpatient Hospital Stay (HOSPITAL_COMMUNITY): Payer: BLUE CROSS/BLUE SHIELD

## 2020-01-07 ENCOUNTER — Inpatient Hospital Stay (HOSPITAL_COMMUNITY)
Admission: AD | Admit: 2020-01-07 | Discharge: 2020-01-09 | DRG: 807 | Disposition: A | Discharge status: Home / Self Care | Payer: BLUE CROSS/BLUE SHIELD | Attending: Family Medicine | Admitting: Family Medicine

## 2020-01-07 ENCOUNTER — Other Ambulatory Visit: Payer: Self-pay

## 2020-01-07 DIAGNOSIS — O9902 Anemia complicating childbirth: Secondary | ICD-10-CM | POA: Diagnosis present

## 2020-01-07 DIAGNOSIS — Z20822 Contact with and (suspected) exposure to covid-19: Secondary | ICD-10-CM | POA: Diagnosis present

## 2020-01-07 DIAGNOSIS — O3663X0 Maternal care for excessive fetal growth, third trimester, not applicable or unspecified: Principal | ICD-10-CM | POA: Diagnosis present

## 2020-01-07 DIAGNOSIS — O09299 Supervision of pregnancy with other poor reproductive or obstetric history, unspecified trimester: Secondary | ICD-10-CM

## 2020-01-07 DIAGNOSIS — D649 Anemia, unspecified: Secondary | ICD-10-CM | POA: Diagnosis present

## 2020-01-07 DIAGNOSIS — Z3A39 39 weeks gestation of pregnancy: Secondary | ICD-10-CM

## 2020-01-07 LAB — TYPE AND SCREEN
ABO/RH(D): A POS
Antibody Screen: NEGATIVE

## 2020-01-07 LAB — CBC
HCT: 32.9 % — ABNORMAL LOW (ref 36.0–46.0)
Hemoglobin: 10.6 g/dL — ABNORMAL LOW (ref 12.0–15.0)
MCH: 28.8 pg (ref 26.0–34.0)
MCHC: 32.2 g/dL (ref 30.0–36.0)
MCV: 89.4 fL (ref 80.0–100.0)
Platelets: 227 10*3/uL (ref 150–400)
RBC: 3.68 MIL/uL — ABNORMAL LOW (ref 3.87–5.11)
RDW: 14.2 % (ref 11.5–15.5)
WBC: 10.5 10*3/uL (ref 4.0–10.5)
nRBC: 0 % (ref 0.0–0.2)

## 2020-01-07 LAB — RPR: RPR Ser Ql: NONREACTIVE

## 2020-01-07 LAB — ABO/RH: ABO/RH(D): A POS

## 2020-01-07 MED ORDER — OXYCODONE-ACETAMINOPHEN 5-325 MG PO TABS: 2.0000 | ORAL_TABLET | ORAL | Status: DC | PRN

## 2020-01-07 MED ORDER — ACETAMINOPHEN 325 MG PO TABS: 650.0000 mg | ORAL_TABLET | ORAL | Status: DC | PRN

## 2020-01-07 MED ORDER — OXYTOCIN-SODIUM CHLORIDE 30-0.9 UT/500ML-% IV SOLN
1.0000 m[IU]/min | INTRAVENOUS | Status: DC
  Administered 2020-01-07: 2 m[IU]/min via INTRAVENOUS
  Filled 2020-01-07: qty 500

## 2020-01-07 MED ORDER — OXYTOCIN BOLUS FROM INFUSION
333.0000 mL | Freq: Once | INTRAVENOUS | Status: AC
  Administered 2020-01-07: 333 mL via INTRAVENOUS

## 2020-01-07 MED ORDER — LIDOCAINE HCL (PF) 1 % IJ SOLN
30.0000 mL | INTRAMUSCULAR | Status: AC | PRN
  Administered 2020-01-07: 30 mL via SUBCUTANEOUS
  Filled 2020-01-07: qty 30

## 2020-01-07 MED ORDER — SOD CITRATE-CITRIC ACID 500-334 MG/5ML PO SOLN: 30.0000 mL | ORAL | Status: DC | PRN

## 2020-01-07 MED ORDER — OXYCODONE-ACETAMINOPHEN 5-325 MG PO TABS: 1.0000 | ORAL_TABLET | ORAL | Status: DC | PRN

## 2020-01-07 MED ORDER — ONDANSETRON HCL 4 MG/2ML IJ SOLN: 4.0000 mg | Freq: Four times a day (QID) | INTRAMUSCULAR | Status: DC | PRN

## 2020-01-07 MED ORDER — OXYTOCIN-SODIUM CHLORIDE 30-0.9 UT/500ML-% IV SOLN: 2.5000 [IU]/h | INTRAVENOUS | Status: DC

## 2020-01-07 MED ORDER — LACTATED RINGERS IV SOLN: INTRAVENOUS | Status: DC

## 2020-01-07 MED ORDER — LACTATED RINGERS IV SOLN: 500.0000 mL | INTRAVENOUS | Status: DC | PRN

## 2020-01-07 MED ORDER — TERBUTALINE SULFATE 1 MG/ML IJ SOLN: 0.2500 mg | Freq: Once | INTRAMUSCULAR | Status: DC | PRN

## 2020-01-07 NOTE — Progress Notes (Signed)
Sara Cantu is a 34 y.o. G3P2002 at [redacted]w[redacted]d admitted for induction of labor due to Hx of LGA baby.  Subjective: Patient doing well, feeling her contractions but able to breathe through most of them.   Objective: BP (!) 96/49   Pulse 78   Temp (!) 97.3 F (36.3 C) (Axillary)   Resp 17   Ht 5\' 9"  (1.753 m)   Wt 91.1 kg   LMP 04/08/2019   SpO2 100%   BMI 29.65 kg/m  No intake/output data recorded.  FHT:  FHR: 150 bpm, variability: moderate,  accelerations:  Present,  decelerations:  Absent UC:   regular, every 1-3 minutes  SVE:   Dilation: 5 Effacement (%): 60 Station: -2 Exam by:: marie williams, cnm  Pitocin @ 20 mu/min  Labs: Lab Results  Component Value Date   WBC 10.5 01/07/2020   HGB 10.6 (L) 01/07/2020   HCT 32.9 (L) 01/07/2020   MCV 89.4 01/07/2020   PLT 227 01/07/2020    Assessment / Plan: Sara Cantu is a 34 y.o. G3P2002 at [redacted]w[redacted]d admitted for IOL for hx of LGA baby.  #Labor: S/p FB, progressing on Pitocin. AROM completed at this exam. Continue to titrate Pitocin as necessary to adequate contractions. Anticipating vaginal delivery. #FWB: Cat I #Pain Control: Labor support without medications. #I/D:  GBS negative #MOF: Breast #MOC: NFP-condoms #Circ: Yes if female infant   [redacted]w[redacted]d DO PGY1, Family Medicine Resident 01/07/2020, 9:44 PM

## 2020-01-07 NOTE — Plan of Care (Signed)
L&D careplan completed 

## 2020-01-07 NOTE — Progress Notes (Signed)
Sara Cantu is a 34 y.o. G3P2002 at [redacted]w[redacted]d by LMP admitted for induction of labor due to hx of LGA baby (no GDM).  Subjective: Pt ambulating in room, feeling contractions more consistently.   Objective: BP (!) 104/58   Pulse 79   Temp (!) 97.3 F (36.3 C) (Axillary)   Resp 16   Ht 5\' 9"  (1.753 m)   Wt 200 lb 12.8 oz (91.1 kg)   LMP 04/08/2019   SpO2 100%   BMI 29.65 kg/m  No intake/output data recorded. No intake/output data recorded.  FHT:  FHR: 135 bpm, variability: moderate,  accelerations:  Present,  decelerations:  Absent UC:   regular, every 2-3 minutes SVE:   Dilation: 5.5 Effacement (%): 70 Station: -1 Exam by:: Avory Rahimi  Labs: Lab Results  Component Value Date   WBC 10.5 01/07/2020   HGB 10.6 (L) 01/07/2020   HCT 32.9 (L) 01/07/2020   MCV 89.4 01/07/2020   PLT 227 01/07/2020    Assessment / Plan: Induction of labor due to hx of LGA baby,  progressing well on pitocin - side lying release done with patient, attempted AROM (baby very well applied to cervix and only scant water noted on exam glove), pt to get on birthing ball to encourage descent.  Labor: Progressing on Pitocin, will continue to increase then AROM Preeclampsia:  no signs or symptoms of toxicity Fetal Wellbeing:  Category I Pain Control:  Labor support without medications I/D:  n/a Anticipated MOD:  NSVD  03/08/2020, CNM, MSN, [facility] 01/07/20 7:51 PM

## 2020-01-07 NOTE — Progress Notes (Signed)
Necha Harries is a 34 y.o. G3P2002 at [redacted]w[redacted]d by LMP admitted for IOL for hx LGA baby (no GDM).  Subjective: Pt resting in bed, not feeling many contractions, requested to be checked.  Objective: BP (!) 104/58   Pulse 79   Temp (!) 97.3 F (36.3 C) (Axillary)   Resp 16   Ht 5\' 9"  (1.753 m)   Wt 200 lb 12.8 oz (91.1 kg)   LMP 04/08/2019   SpO2 100%   BMI 29.65 kg/m  No intake/output data recorded. No intake/output data recorded.  FHT:  FHR: 135 bpm, variability: moderate,  accelerations:  Present,  decelerations:  Absent UC:   irregular, every 4-6 minutes SVE:   4/70/ballotable  Labs: Lab Results  Component Value Date   WBC 10.5 01/07/2020   HGB 10.6 (L) 01/07/2020   HCT 32.9 (L) 01/07/2020   MCV 89.4 01/07/2020   PLT 227 01/07/2020    Assessment / Plan: IOL for hx of LGA baby - baby still ballotable enough that I verified cephalic presentation with U/S (done by 03/08/2020 CNM). Instructed pt to walk while we titrate Pitocin for a stronger, more consistent contraction pattern  Labor: Progressing on Pitocin, will continue to increase then AROM Preeclampsia:  no signs or symptoms of toxicity Fetal Wellbeing:  Category I Pain Control:  Labor support without medications I/D:  n/a Anticipated MOD:  NSVD  Thressa Sheller, CNM, MSN, [facility] 01/07/20 1345

## 2020-01-07 NOTE — Progress Notes (Signed)
Sara Cantu is a 34 y.o. G3P2002 at [redacted]w[redacted]d by LMP admitted for induction of labor due to Elective at term for hx of LGA baby.  Subjective: Mom walking around room, FOB at bedside for support. Not feeling contractions after nipple stim, amenable to labor augmentation.  Objective: BP 106/63   Pulse 71   Temp 97.9 F (36.6 C) (Oral)   Resp 17   Ht 5\' 9"  (1.753 m)   Wt 200 lb 12.8 oz (91.1 kg)   LMP 04/08/2019   SpO2 100%   BMI 29.65 kg/m  No intake/output data recorded. No intake/output data recorded.  FHT:  FHR: 135 bpm, variability: moderate,  accelerations:  Present,  decelerations:  Absent UC:   irregular, every 2-8 minutes SVE:   Dilation: 4 Effacement (%): 70 Station: Ballotable Exam by:: [email or web address], RN   Labs: Lab Results  Component Value Date   WBC 10.5 01/07/2020   HGB 10.6 (L) 01/07/2020   HCT 32.9 (L) 01/07/2020   MCV 89.4 01/07/2020   PLT 227 01/07/2020    Assessment / Plan: IOL for suspected LGA baby  Labor: Latent labor, to begin Pitocin after breakfast Preeclampsia:  no signs or symptoms of toxicity Fetal Wellbeing:  Category I Pain Control:  Labor support without medications I/D:  n/a Anticipated MOD:  NSVD  03/08/2020, CNM, MSN, [facility] 01/07/20 11:06 AM

## 2020-01-07 NOTE — H&P (Addendum)
LABOR AND DELIVERY ADMISSION HISTORY AND PHYSICAL NOTE  Sara Cantu is a 34 y.o. female G52P2002 with IUP at 103w1dby LMP c/w 19w [place] I[place] for history of LGA in prior pregnancy.   She reports positive fetal movement. She denies leakage of fluid, vaginal bleeding, or contractions.   She plans on  breast feeding. Her contraception plan is: NFP/condoms.  Prenatal History/Complications: [facility] at [facility]  '@[redacted]w[redacted]d' , CWD, normal anatomy, cephalic presentation, posterior placenta, 20%ile, [id number] Pregnancy complications:  - History of Macrosomia in Prior Pregnancy - Anemia in Pregnancy  Past Medical History: Past Medical History:  Diagnosis Date   Asthma    exercise induced when young   Complication of anesthesia    woke up before surgery completed, and remedicated and numb from neck down x 2 hours    Past Surgical History: Past Surgical History:  Procedure Laterality Date   TONSILLECTOMY      Obstetrical History: OB History     Gravida  3   Para  2   Term  2   Preterm      AB      Living  2      SAB      TAB      Ectopic      Multiple      Live Births  2        Obstetric Comments  G2: no tears.          Social History: Social History   Socioeconomic History   Marital status: Married    Spouse name: JNasya Vincent  Number of children: Not on file   Years of education: Not on file   Highest education level: Not on file  Occupational History   Not on file  Tobacco Use   Smoking status: Never Smoker   Smokeless tobacco: Never Used  Vaping Use   Vaping Use: Never used  Substance and Sexual Activity   Alcohol use: Never   Drug use: Never   Sexual activity: Yes    Birth control/protection: None  Other Topics Concern   Not on file  Social History Narrative   Not on file   Social Determinants of Health   Financial Resource Strain:    Difficulty of Paying Living Expenses:   Food Insecurity: No Food Insecurity   Worried  About Running Out of Food in the Last Year: Never true   [place] the Last [place]: Never true  Transportation Needs: No Transportation Needs   Lack of Transportation (Medical): No   Lack of Transportation (Non-Medical): No  Physical Activity:    Days of Exercise per Week:    Minutes of Exercise per Session:   Stress:    Feeling of Stress :   Social Connections:    Frequency of Communication with Friends and Family:    Frequency of Social Gatherings with Friends and Family:    Attends Religious Services:    Active Member of Clubs or Organizations:    Attends [employer] therapist    Marital Status:     Family History: Family History  Problem Relation Age of Onset   Asthma Mother    Skin cancer Father    Diabetes Mellitus I Brother     Allergies: Allergies  Allergen Reactions   Shellfish Allergy Swelling    Tongue swollen with eating shrimp    Medications Prior to Admission  Medication Sig Dispense Refill Last Dose  cetirizine (ZYRTEC) 10 MG tablet Take 1 tablet by mouth as needed.   01/06/2020 at Unknown time   ferrous sulfate 325 (65 FE) MG tablet Take 1 tablet (325 mg total) by mouth daily. 30 tablet 3 01/06/2020 at Unknown time   Nutritional Supplements (JUICE PLUS FIBRE PO) Take 4 capsules by mouth daily. Using as PNV   01/06/2020 at Unknown time   Blood Pressure Monitoring (BLOOD PRESSURE KIT) DEVI 1 Device by Does not apply route daily. ICD 10: Z34.00 1 each 0      Review of Systems  All systems reviewed and negative except as stated in HPI  Physical Exam Blood pressure 114/66, pulse 83, temperature 98.4 F (36.9 C), temperature source Oral, resp. rate 16, height '5\' 9"'  (1.753 m), weight 91.1 kg, last menstrual period 04/08/2019, SpO2 100 %. General appearance: alert, oriented, NAD Lungs: normal respiratory effort Heart: regular rate Abdomen: soft, non-tender; gravid Extremities: No calf swelling or tenderness Presentation: Cephalic by SVE (by Maryelizabeth Kaufmann, CNM)   Fetal monitoringBaseline: 130 bpm, Variability: Good {> 6 bpm), Accelerations: Reactive and Decelerations: Absent Uterine activity: Irregular every 2-10 minutes  Dilation: 1.5 Effacement (%): 50 Station: Ballotable Exam by:: Maryelizabeth Kaufmann, CNM  Prenatal labs: ABO, Rh: A/Positive/-- (02/01 1441) Antibody: Negative (02/01 1441) Rubella: 9.85 (02/01 1441) RPR: Non Reactive (05/17 0911)  HBsAg: Negative (02/01 1441)  HIV: Non Reactive (05/17 0911)  GC/Chlamydia: Negative  GBS: Negative/-- (07/09 1144)  2-hr GTT: 66-67-109 Genetic screening:  Low risk Anatomy US: Normal  Prenatal Transfer Tool  Maternal Diabetes: No Genetic Screening: Normal Maternal Ultrasounds/Referrals: Normal Fetal Ultrasounds or other Referrals:  None Maternal Substance Abuse:  No Significant Maternal Medications:  None Significant Maternal Lab Results: Group B Strep negative  Results for orders placed or performed during the hospital encounter of 01/07/20 (from the past 24 hour(s))  CBC   Collection Time: 01/07/20 12:28 AM  Result Value Ref Range   WBC 10.5 4.0 - 10.5 K/uL   RBC 3.68 (L) 3.87 - 5.11 MIL/uL   Hemoglobin 10.6 (L) 12.0 - 15.0 g/dL   HCT 32.9 (L) 36 - 46 %   MCV 89.4 80.0 - 100.0 fL   MCH 28.8 26.0 - 34.0 pg   MCHC 32.2 30.0 - 36.0 g/dL   RDW 14.2 11.5 - 15.5 %   Platelets 227 150 - 400 K/uL   nRBC 0.0 0.0 - 0.2 %  Results for orders placed or performed during the hospital encounter of 01/06/20 (from the past 24 hour(s))  SARS CORONAVIRUS 2 (TAT 6-24 HRS) Nasopharyngeal Nasopharyngeal Swab   Collection Time: 01/06/20  8:42 AM   Specimen: Nasopharyngeal Swab  Result Value Ref Range   SARS Coronavirus 2 NEGATIVE NEGATIVE    Patient Active Problem List   Diagnosis Date Noted   History of macrosomia in infant in prior pregnancy, currently pregnant 01/07/2020   History of migraine 11/06/2019   Anemia in pregnancy 10/23/2019   HX of LGA baby 07/08/2019   Supervision  of low-risk pregnancy 07/04/2019    Assessment: Sara Cantu is a 34 y.o. G3P2002 at 75w1dhere for IOL for hx of LGA.  #Labor: Patient prefers natural birth, FB was placed at this exam. Patient prefers to avoid medications including Cytotec and Pitocin. Anticipating vaginal delivery. #Pain: IV pain meds PRN, epidural available upon request #FWB: Cat I #GBS/ID: Negative #COVID: Negative #MOF: Breast #MOC: NFP/condoms #Circ: Yes if female infant #Anemia in Pregnancy: Hgb 11.3 on 7/16. Admission Hgb 10.6. #Hx of  LGA: Previous infant 4510g delivered vaginally.  Rise Patience, DO PGY 1 Family Medicine 01/07/2020, 1:52 AM  Attestation of Supervision of Student:  I confirm that I have verified the information documented in the  resident's  note and that I have also personally reperformed the history, physical exam and all medical decision making activities.  I have verified that all services and findings are accurately documented in this student's note; and I agree with management and plan as outlined in the documentation. I have also made any necessary editorial changes.  --Membranes swept with FB placement --Discussed possible nipple stim PRN  Darlina Rumpf, [place] for [employer], [facility] 01/07/2020 3:11 AM

## 2020-01-07 NOTE — Anesthesia Preprocedure Evaluation (Deleted)
Anesthesia Evaluation Anesthesia Physical Anesthesia Plan Anesthesia Quick Evaluation  

## 2020-01-07 NOTE — Progress Notes (Signed)
Sara Cantu is a 34 y.o. G3P2002 at [redacted]w[redacted]d   Subjective: Denies complaints or concerns, requests membrane sweep  Objective: BP 105/67    Pulse 79    Temp 98.4 F (36.9 C) (Oral)    Resp 18    Ht 5\' 9"  (1.753 m)    Wt 91.1 kg    LMP 04/08/2019    SpO2 100%    BMI 29.65 kg/m  No intake/output data recorded. No intake/output data recorded.  FHT:  FHR: 135 bpm, variability: moderate,  accelerations:  Present,  decelerations:  Absent and   UC:   irregular, every 4-8 minutes SVE:   Dilation: 4 Effacement (%): 70 Station: Ballotable Exam by:: [email or web address], CNM  Labs: Lab Results  Component Value Date   WBC 10.5 01/07/2020   HGB 10.6 (L) 01/07/2020   HCT 32.9 (L) 01/07/2020   MCV 89.4 01/07/2020   PLT 227 01/07/2020    Assessment / Plan: --Cat I tracing --GBS neg --Desires unmedicated labor without few interventions --S/p foley balloon --Membranes swept --Patient agreeable to nipple stim, manual pump provided --Encouraged frequent position changes to improve engagement --Anticipate NSVD  03/08/2020, CNM 01/07/2020, 5:01 AM

## 2020-01-08 ENCOUNTER — Encounter (HOSPITAL_COMMUNITY): Payer: Self-pay | Admitting: Obstetrics and Gynecology

## 2020-01-08 MED ORDER — COCONUT OIL OIL: 1.0000 "application " | TOPICAL_OIL | Status: DC | PRN

## 2020-01-08 MED ORDER — ACETAMINOPHEN 325 MG PO TABS: 650.0000 mg | ORAL_TABLET | ORAL | Status: DC | PRN

## 2020-01-08 MED ORDER — METHYLERGONOVINE MALEATE 0.2 MG/ML IJ SOLN: 0.2000 mg | INTRAMUSCULAR | Status: DC | PRN

## 2020-01-08 MED ORDER — DIPHENHYDRAMINE HCL 25 MG PO CAPS: 25.0000 mg | ORAL_CAPSULE | Freq: Four times a day (QID) | ORAL | Status: DC | PRN

## 2020-01-08 MED ORDER — ONDANSETRON HCL 4 MG/2ML IJ SOLN: 4.0000 mg | INTRAMUSCULAR | Status: DC | PRN

## 2020-01-08 MED ORDER — ONDANSETRON HCL 4 MG PO TABS: 4.0000 mg | ORAL_TABLET | ORAL | Status: DC | PRN

## 2020-01-08 MED ORDER — WITCH HAZEL-GLYCERIN EX PADS: 1.0000 "application " | MEDICATED_PAD | CUTANEOUS | Status: DC | PRN

## 2020-01-08 MED ORDER — PRENATAL MULTIVITAMIN CH
1.0000 | ORAL_TABLET | Freq: Every day | ORAL | Status: DC
  Administered 2020-01-08: 1 via ORAL
  Filled 2020-01-08 (×2): qty 1

## 2020-01-08 MED ORDER — OXYCODONE HCL 5 MG PO TABS: 5.0000 mg | ORAL_TABLET | ORAL | Status: DC | PRN

## 2020-01-08 MED ORDER — SIMETHICONE 80 MG PO CHEW: 80.0000 mg | CHEWABLE_TABLET | ORAL | Status: DC | PRN

## 2020-01-08 MED ORDER — IBUPROFEN 600 MG PO TABS
600.0000 mg | ORAL_TABLET | Freq: Four times a day (QID) | ORAL | Status: DC
  Administered 2020-01-08 – 2020-01-09 (×5): 600 mg via ORAL
  Filled 2020-01-08 (×6): qty 1

## 2020-01-08 MED ORDER — METHYLERGONOVINE MALEATE 0.2 MG PO TABS: 0.2000 mg | ORAL_TABLET | ORAL | Status: DC | PRN

## 2020-01-08 MED ORDER — DIBUCAINE (PERIANAL) 1 % EX OINT: 1.0000 "application " | TOPICAL_OINTMENT | CUTANEOUS | Status: DC | PRN

## 2020-01-08 MED ORDER — OXYTOCIN-SODIUM CHLORIDE 30-0.9 UT/500ML-% IV SOLN: 2.5000 [IU]/h | INTRAVENOUS | Status: DC | PRN

## 2020-01-08 MED ORDER — BENZOCAINE-MENTHOL 20-0.5 % EX AERO: 1.0000 "application " | INHALATION_SPRAY | CUTANEOUS | Status: DC | PRN

## 2020-01-08 MED ORDER — OXYCODONE HCL 5 MG PO TABS: 10.0000 mg | ORAL_TABLET | ORAL | Status: DC | PRN

## 2020-01-08 NOTE — Lactation Note (Signed)
This note was copied from a baby's chart. Lactation Consultation Note  Patient Name: Girl Latessa Tillis [id number] Date: 01/08/2020 Reason for consult: Initial assessment   Mother is a P3, infant is 63 hours old   Mother was given [facility] brochure and basic teaching done.  Mother reports that infant is feeding well.   Mother reports having difficulty getting infant latched on with good depth. Discussed asymmetrical latch technique. Mother receptive. Infant sleeping in crib. Suggested that mother page for assistance when infant feeds again. Lots of tips on breastfeeding.   Mother to continue to cue base feed infant and feed at least 8-12 times or more in 24 hours and advised to allow for cluster feeding infant as needed.   Mother to continue to due STS. Mother is aware of available LC services at [facility], BFSG'S, OP Dept, and phone # for questions or concerns about breastfeeding.  Mother receptive to all teaching and plan of care.     Maternal Data Has patient been taught Hand Expression?: Yes Does the patient have breastfeeding experience prior to this delivery?: Yes  Feeding Feeding Type: Breast Fed  LATCH Score Latch: Repeated attempts needed to sustain latch, nipple held in mouth throughout feeding, stimulation needed to elicit sucking reflex.  Audible Swallowing: A few with stimulation  Type of Nipple: Everted at rest and after stimulation  Comfort (Breast/Nipple): Soft / non-tender  Hold (Positioning): Assistance needed to correctly position infant at breast and maintain latch.  LATCH Score: 7  Interventions    Lactation Tools Discussed/Used     Consult Status Consult Status: Follow-up Date: 01/09/20 Follow-up type: In-patient    Stevan Born [facility] 01/08/2020, 3:45 PM

## 2020-01-08 NOTE — Discharge Summary (Signed)
Postpartum Discharge Summary     Patient Name: Sara Cantu DOB: 03-13-1986 MRN: [id number]  Date of admission: 01/07/2020 Delivery date:01/07/2020  Delivering provider: Rise Patience  Date of discharge: 01/09/2020  Admitting diagnosis: History of macrosomia in infant in prior pregnancy, currently pregnant [O09.299] Intrauterine pregnancy: [redacted]w[redacted]d    Secondary diagnosis:  Active Problems:   History of macrosomia in infant in prior pregnancy, currently pregnant  Additional problems: none    Discharge diagnosis: Term Pregnancy Delivered                                              Post partum procedures:None Augmentation: AROM, Pitocin and IP Foley Complications: None  Hospital course: Induction of Labor With Vaginal Delivery   34y.o. yo G3P2002 at 387w2das admitted to the hospital 01/07/2020 for induction of labor.  Indication for induction: History of LGA baby.  Patient had an uncomplicated labor course as follows: Membrane Rupture Time/Date: 9:42 PM ,01/07/2020   Delivery Method:Vaginal, Spontaneous  Episiotomy: None  Lacerations:  1st degree;Perineal  Details of delivery can be found in separate delivery note.  Patient had a routine postpartum course. Patient is discharged home 01/09/20.  Newborn Data: Birth date:01/07/2020  Birth time:10:50 PM  Gender:Female  Living status:Living  Apgars:8 ,9  Weight:8 lb 15.5 oz (4.068 kg)   Magnesium Sulfate received: No BMZ received: No Rhophylac:N/A MMR:N/A T-DaP:Given prenatally Flu: N/A Transfusion:No  Physical exam  Vitals:   01/08/20 1020 01/08/20 1507 01/08/20 2205 01/09/20 0500  BP: 106/74 102/64 105/64 103/60  Pulse: 64 71 72 (!) 59  Resp:  _0 Temp: 98.5 F (36.9 C) 98.4 F (36.9 C) 98.3 F (36.8 C) 97.9 F (36.6 C)  TempSrc: Oral Oral Oral Oral  SpO2: 99% 99%    Weight:      Height:       General: alert, cooperative and no distress Lochia: appropriate Uterine Fundus: firm Incision: N/A DVT Evaluation:  No evidence of DVT seen on physical exam. Negative Homan's sign. No cords or calf tenderness. No significant calf/ankle edema. Labs: Lab Results  Component Value Date   WBC 10.5 01/07/2020   HGB 10.6 (L) 01/07/2020   HCT 32.9 (L) 01/07/2020   MCV 89.4 01/07/2020   PLT 227 01/07/2020   CMP Latest Ref Rng & Units 07/08/2019  Glucose 65 - 99 mg/dL 71   Edinburgh Score: Edinburgh Postnatal Depression Scale Screening Tool 01/08/2020  I have been able to laugh and see the funny side of things. 0  I have looked forward with enjoyment to things. 0  I have blamed myself unnecessarily when things went wrong. 0  I have been anxious or worried for no good reason. 0  I have felt scared or panicky for no good reason. 0  Things have been getting on top of me. 0  I have been so unhappy that I have had difficulty sleeping. 0  I have felt sad or miserable. 0  I have been so unhappy that I have been crying. 0  The thought of harming myself has occurred to me. 0  Edinburgh Postnatal Depression Scale Total 0      After visit meds:  Allergies as of 01/09/2020      Reactions   Shellfish Allergy Swelling   Tongue swollen with eating shrimp      Medication List  TAKE these medications   Blood Pressure Kit Devi 1 Device by Does not apply route daily. ICD 10: Z34.00   ferrous sulfate 325 (65 FE) MG tablet Take 1 tablet (325 mg total) by mouth daily.   ibuprofen 600 MG tablet Commonly known as: ADVIL Take 1 tablet (600 mg total) by mouth every 6 (six) hours.        Discharge home in stable condition Infant Feeding: No evidence of DVT seen on physical exam. Negative Homan's sign. No cords or calf tenderness. No significant calf/ankle edema. Infant Disposition:home with mother Discharge instruction: per After Visit Summary and Postpartum booklet. Activity: Advance as tolerated. Pelvic rest for 6 weeks.  Diet: routine diet Anticipated Birth Control: NFP/condoms Postpartum  Appointment:4 weeks Additional Postpartum F/U: None Future Appointments:No future appointments. Follow up Visit:  Gaylan Gerold, CNM, MSN, [facility] 01/09/20 11:52 AM

## 2020-01-08 NOTE — Progress Notes (Signed)
Post Partum Day 1 Subjective: no complaints, up ad lib, voiding and tolerating PO  Objective: Blood pressure 97/63, pulse 88, temperature 98.7 F (37.1 C), resp. rate 17, height 5\' 9"  (1.753 m), weight 91.1 kg, last menstrual period 04/08/2019, SpO2 96 %, unknown if currently breastfeeding.  Physical Exam:  General: alert, cooperative and no distress Lochia: appropriate Uterine Fundus: firm Incision: perineum healing DVT Evaluation: No evidence of DVT seen on physical exam.  Recent Labs    01/07/20 0028  HGB 10.6*  HCT 32.9*    Assessment/Plan: Plan for discharge tomorrow   LOS: 1 day   03/08/20 01/08/2020, 6:34 AM

## 2020-01-09 MED ORDER — IBUPROFEN 600 MG PO TABS: 600.0000 mg | ORAL_TABLET | Freq: Four times a day (QID) | ORAL | 0 refills | Status: DC

## 2020-01-09 NOTE — Discharge Instructions (Signed)

## 2020-01-09 NOTE — Lactation Note (Addendum)
This note was copied from a baby's chart. Lactation Consultation Note  Patient Name: Sara Cantu [id number] Date: 01/09/2020 Reason for consult: Follow-up assessment   Infant is 57 hours old. Infant is at 9 % wt loss. Infant has excessive amt of wet and dirty diapers. Mother reports that infant is feeding well. She reports that tips that were given to her yesterday really helped with getting infant latched on better. Mother denies having any questions or concerns about breastfeeding.  Discussed treatment and prevention of engorgement.  Mother to continue to cue base feed infant and feed at least 8-12 times or more in 24 hours and advised to allow for cluster feeding infant as needed.   Mother to continue to due STS. Mother is aware of available LC services at [facility], BFSG'S, OP Dept, and phone # for questions or concerns about breastfeeding.  Mother receptive to all teaching and plan of care.     Maternal Data    Feeding    LATCH Score                   Interventions    Lactation Tools Discussed/Used     Consult Status Consult Status: Complete    Michel Bickers 01/09/2020, 12:09 PM

## 2020-01-10 ENCOUNTER — Encounter: Payer: BLUE CROSS/BLUE SHIELD | Admitting: Obstetrics and Gynecology

## 2020-01-15 ENCOUNTER — Encounter: Payer: BLUE CROSS/BLUE SHIELD | Admitting: Medical

## 2020-01-15 ENCOUNTER — Other Ambulatory Visit: Payer: BLUE CROSS/BLUE SHIELD

## 2020-01-23 ENCOUNTER — Telehealth: Payer: Self-pay

## 2020-01-23 NOTE — Telephone Encounter (Signed)
Called Pt to advise that we received message from [place] Gaining, RN from [employer]. [facility], regard lump under left armpit. Left message that some one will be calling to schedule her a Nurse visit, no answer, left VM.

## 2020-01-23 NOTE — Telephone Encounter (Signed)
Shaunda Gaining,RN  from [employer] called to advise that she spoke with Pt Sara Cantu & she told her that she felt a lump under her left armpit, it's not red but hot to touch.Called left on Nurse line on 01/22/20 @ 11:58am. Reviewed with Vonzella Nipple PA-C, advised to schedule Pt for Nurse visit & we'll go from there.

## 2020-02-13 ENCOUNTER — Encounter: Payer: Self-pay | Admitting: General Practice

## 2020-02-20 ENCOUNTER — Other Ambulatory Visit: Payer: Self-pay

## 2020-02-20 ENCOUNTER — Ambulatory Visit (INDEPENDENT_AMBULATORY_CARE_PROVIDER_SITE_OTHER): Payer: BLUE CROSS/BLUE SHIELD | Admitting: Student

## 2020-02-20 ENCOUNTER — Encounter: Payer: Self-pay | Admitting: Student

## 2020-02-20 DIAGNOSIS — N6320 Unspecified lump in the left breast, unspecified quadrant: Secondary | ICD-10-CM

## 2020-02-20 NOTE — Patient Instructions (Signed)
Health Maintenance, Female Adopting a healthy lifestyle and getting preventive care are important in promoting health and wellness. Ask your health care provider about:  The right schedule for you to have regular tests and exams.  Things you can do on your own to prevent diseases and keep yourself healthy. What should I know about diet, weight, and exercise? Eat a healthy diet   Eat a diet that includes plenty of vegetables, fruits, low-fat dairy products, and lean protein.  Do not eat a lot of foods that are high in solid fats, added sugars, or sodium. Maintain a healthy weight Body mass index (BMI) is used to identify weight problems. It estimates body fat based on height and weight. Your health care provider can help determine your BMI and help you achieve or maintain a healthy weight. Get regular exercise Get regular exercise. This is one of the most important things you can do for your health. Most adults should:  Exercise for at least 150 minutes each week. The exercise should increase your heart rate and make you sweat (moderate-intensity exercise).  Do strengthening exercises at least twice a week. This is in addition to the moderate-intensity exercise.  Spend less time sitting. Even light physical activity can be beneficial. Watch cholesterol and blood lipids Have your blood tested for lipids and cholesterol at 33 years of age, then have this test every 5 years. Have your cholesterol levels checked more often if:  Your lipid or cholesterol levels are high.  You are older than 33 years of age.  You are at high risk for heart disease. What should I know about cancer screening? Depending on your health history and family history, you may need to have cancer screening at various ages. This may include screening for:  Breast cancer.  Cervical cancer.  Colorectal cancer.  Skin cancer.  Lung cancer. What should I know about heart disease, diabetes, and high blood  pressure? Blood pressure and heart disease  High blood pressure causes heart disease and increases the risk of stroke. This is more likely to develop in people who have high blood pressure readings, are of African descent, or are overweight.  Have your blood pressure checked: ? Every 3-5 years if you are 18-39 years of age. ? Every year if you are 40 years old or older. Diabetes Have regular diabetes screenings. This checks your fasting blood sugar level. Have the screening done:  Once every three years after age 40 if you are at a normal weight and have a low risk for diabetes.  More often and at a younger age if you are overweight or have a high risk for diabetes. What should I know about preventing infection? Hepatitis B If you have a higher risk for hepatitis B, you should be screened for this virus. Talk with your health care provider to find out if you are at risk for hepatitis B infection. Hepatitis C Testing is recommended for:  Everyone born from 1945 through 1965.  Anyone with known risk factors for hepatitis C. Sexually transmitted infections (STIs)  Get screened for STIs, including gonorrhea and chlamydia, if: ? You are sexually active and are younger than 33 years of age. ? You are older than 33 years of age and your health care provider tells you that you are at risk for this type of infection. ? Your sexual activity has changed since you were last screened, and you are at increased risk for chlamydia or gonorrhea. Ask your health care provider if   you are at risk.  Ask your health care provider about whether you are at high risk for HIV. Your health care provider may recommend a prescription medicine to help prevent HIV infection. If you choose to take medicine to prevent HIV, you should first get tested for HIV. You should then be tested every 3 months for as long as you are taking the medicine. Pregnancy  If you are about to stop having your period (premenopausal) and  you may become pregnant, seek counseling before you get pregnant.  Take 400 to 800 micrograms (mcg) of folic acid every day if you become pregnant.  Ask for birth control (contraception) if you want to prevent pregnancy. Osteoporosis and menopause Osteoporosis is a disease in which the bones lose minerals and strength with aging. This can result in bone fractures. If you are 65 years old or older, or if you are at risk for osteoporosis and fractures, ask your health care provider if you should:  Be screened for bone loss.  Take a calcium or vitamin D supplement to lower your risk of fractures.  Be given hormone replacement therapy (HRT) to treat symptoms of menopause. Follow these instructions at home: Lifestyle  Do not use any products that contain nicotine or tobacco, such as cigarettes, e-cigarettes, and chewing tobacco. If you need help quitting, ask your health care provider.  Do not use street drugs.  Do not share needles.  Ask your health care provider for help if you need support or information about quitting drugs. Alcohol use  Do not drink alcohol if: ? Your health care provider tells you not to drink. ? You are pregnant, may be pregnant, or are planning to become pregnant.  If you drink alcohol: ? Limit how much you use to 0-1 drink a day. ? Limit intake if you are breastfeeding.  Be aware of how much alcohol is in your drink. In the [place] one drink equals one 12 oz bottle of beer (355 mL), one 5 oz glass of wine (148 mL), or one 1 oz glass of hard liquor (44 mL). General instructions  Schedule regular health, dental, and eye exams.  Stay current with your vaccines.  Tell your health care provider if: ? You often feel depressed. ? You have ever been abused or do not feel safe at home. Summary  Adopting a healthy lifestyle and getting preventive care are important in promoting health and wellness.  Follow your health care provider's instructions about healthy  diet, exercising, and getting tested or screened for diseases.  Follow your health care provider's instructions on monitoring your cholesterol and blood pressure. This information is not intended to replace advice given to you by your health care provider. Make sure you discuss any questions you have with your health care provider. Document Revised: 05/16/2018 Document Reviewed: 05/16/2018 Elsevier Patient Education  2020 Elsevier Inc.  

## 2020-02-20 NOTE — Progress Notes (Signed)
Post Partum Visit Note  Sara Cantu is a 34 y.o. G63P3003 female who presents for a postpartum visit. She is 6 weeks postpartum following a normal spontaneous vaginal delivery.  I have fully reviewed the prenatal and intrapartum course. The delivery was at 39 + 1  gestational weeks.  Anesthesia: none. Postpartum course has been good . Baby is doing well. Baby is feeding by both breast and bottle - Daron Offer . Bleeding pink. Bowel function is normal. Bladder function is normal. Patient is not sexually active. Contraception method is condoms. Postpartum depression screening: negative. Patient reports a lump in her upper left breast. First noticed it at the beginning of her pregnancy & states it gets as big as a marble at times. It has grown since delivery. Otherwise has not noticed any breast changes or abnormal nipple discharge.   The pregnancy intention screening data noted above was reviewed. Potential methods of contraception were discussed. The patient elected to proceed with Female Condom.    Edinburgh Postnatal Depression Scale - 02/20/20 1104      Edinburgh Postnatal Depression Scale:  In the Past 7 Days   I have looked forward with enjoyment to things. 0    I have blamed myself unnecessarily when things went wrong. 0    I have been anxious or worried for no good reason. 0    I have felt scared or panicky for no good reason. 0    Things have been getting on top of me. 0    I have been so unhappy that I have had difficulty sleeping. 0    I have felt sad or miserable. 0    I have been so unhappy that I have been crying. 0    The thought of harming myself has occurred to me. 0          The following portions of the patient's history were reviewed and updated as appropriate: allergies, current medications, past family history, past medical history, past social history, past surgical history and problem list.  Review of Systems Pertinent items are noted in HPI.     Objective:  Blood pressure 113/70, pulse 73, height 5' 8.5" (1.74 m), last menstrual period 04/08/2019, currently breastfeeding.  General:  alert, cooperative and appears stated age   Breasts:  Breasts equal bilaterally. No breast masses palpate. No nipple drainage. No lymphadenopathy. Small mobile mass just inferior to tail of spence.   Lungs: clear to auscultation bilaterally  Heart:  regular rate and rhythm, S1, S2 normal, no murmur, click, rub or gallop        Assessment:    Normal postpartum exam. Small mass near tail of Spence - will get breat/axilla ultrasound. Pap smear up to date.   Plan:   Essential components of care per ACOG recommendations:  1.  Mood and well being: Patient with negative depression screening today. Reviewed local resources for support.  - Patient does not use tobacco. - hx of drug use? No    2. Infant care and feeding:  -Patient currently breastmilk feeding? No  -Social determinants of health (SDOH) reviewed in EPIC. No concerns  3. Sexuality, contraception and birth spacing - Patient does not want a pregnancy in the next year.  Desired family size is 3 children.  - Reviewed forms of contraception in tiered fashion. Patient desired condoms & natural family planning today.   - Discussed birth spacing of 18 months  4. Sleep and fatigue -Encouraged family/partner/community support of 4 hrs  of uninterrupted sleep to help with mood and fatigue  5. Physical Recovery  - Discussed patients delivery - Patient had a 1st degree laceration, perineal healing reviewed. Patient expressed understanding - Patient has urinary incontinence? No - Patient is safe to resume physical and sexual activity  6.  Health Maintenance - Last pap smear done 07/08/2019 and was normal with negative HPV. -breast ultrasound ordered for mass  7. No Chronic Disease - PCP follow up  Judeth Horn, NP [facility] [employer], [facility]

## 2020-04-13 ENCOUNTER — Encounter: Payer: Self-pay | Admitting: *Deleted

## 2021-12-24 ENCOUNTER — Encounter: Payer: Self-pay | Admitting: Cardiology

## 2021-12-24 ENCOUNTER — Ambulatory Visit: Payer: Medicaid Other | Admitting: Cardiology

## 2021-12-24 VITALS — Ht 67.0 in | Wt 169.0 lb

## 2021-12-24 DIAGNOSIS — R002 Palpitations: Secondary | ICD-10-CM | POA: Diagnosis not present

## 2021-12-24 DIAGNOSIS — R42 Dizziness and giddiness: Secondary | ICD-10-CM | POA: Diagnosis not present

## 2021-12-24 NOTE — Progress Notes (Signed)
Cardiology CONSULT Note    Date:  12/24/2021   ID:  Sara Cantu, DOB 1986-01-29, MRN [id number]  PCP:  Patient, No Pcp Per  Cardiologist:  Armanda Magic, MD   Chief Complaint  Patient presents with   New Patient (Initial Visit)    Dizziness and palpitations    History of Present Illness:  Sara Cantu is a 36 y.o. female who is being seen today for the evaluation of dizziness at the request of Remus Loffler, PA-C.  This is a 36 year old female with a history of asthma who was referred from her PCP due to palpitations and dizziness.  She has had problems with intermittent dizziness and palpitations since her teen years but has never had a cardiac work-up.  She tells me that the dizziness is sporadically but does occur on a daily basis.  It occurs when she stands up too fast.  Rarely it will occur when she is sitting up.  It was worse during her pregnancy a few years ago.  She occasionally will drink some sweet tea but not a lot.  She drinks a lot of water.  She denies any ETOH use.  She denies any palpitations while she is having dizzy spells.  She denies any chest pain or pressures, SOB, DOE, LE edema or palpitations.  She has has had issues with syncope when she was young and once she realized the symptoms before her syncope she could terminate it before it happened.   Past Medical History:  Diagnosis Date   Asthma    exercise induced when young   Complication of anesthesia    woke up before surgery completed, and remedicated and numb from neck down x 2 hours    Past Surgical History:  Procedure Laterality Date   TONSILLECTOMY      Current Medications: Current Meds  Medication Sig   cetirizine (ZYRTEC) 10 MG tablet Take 10 mg by mouth as needed for allergies.    Allergies:   Shellfish allergy   Social History   Socioeconomic History   Marital status: Married    Spouse name: Josslynn Mentzer   Number of children: Not on file   Years of education: Not on file    Highest education level: Not on file  Occupational History   Not on file  Tobacco Use   Smoking status: Never   Smokeless tobacco: Never  Vaping Use   Vaping Use: Never used  Substance and Sexual Activity   Alcohol use: Never   Drug use: Never   Sexual activity: Yes    Birth control/protection: None  Other Topics Concern   Not on file  Social History Narrative   Not on file   Social Determinants of Health   Financial Resource Strain: Not on file  Food Insecurity: No Food Insecurity (01/02/2020)   Hunger Vital Sign    Worried About Running Out of Food in the Last Year: Never true    Ran Out of Food in the Last Year: Never true  Transportation Needs: No Transportation Needs (01/02/2020)   PRAPARE - Administrator, Civil Service (Medical): No    Lack of Transportation (Non-Medical): No  Physical Activity: Not on file  Stress: Not on file  Social Connections: Not on file     Family History:  The patient's family history includes Asthma in her mother; Diabetes Mellitus I in her brother; Skin cancer in her father.   ROS:   Please see the history of present illness.  ROS All other systems reviewed and are negative.      No data to display             PHYSICAL EXAM:   VS:  Ht 5\' 7"  (1.702 m)   Wt 169 lb (76.7 kg)   BMI 26.47 kg/m     GEN: Well nourished, well developed, in no acute distress  HEENT: normal  Neck: no JVD, carotid bruits, or masses Cardiac: RRR; no murmurs, rubs, or gallops,no edema.  Intact distal pulses bilaterally.  Respiratory:  clear to auscultation bilaterally, normal work of breathing GI: soft, nontender, nondistended, + BS MS: no deformity or atrophy  Skin: warm and dry, no rash Neuro:  Alert and Oriented x 3, Strength and sensation are intact Psych: euthymic mood, full affect  Wt Readings from Last 3 Encounters:  12/24/21 169 lb (76.7 kg)  01/07/20 200 lb 12.8 oz (91.1 kg)  01/02/20 198 lb (89.8 kg)      Studies/Labs  Reviewed:   EKG:  EKG is ordered today.  The ekg ordered today demonstrates NSR with sinus arrhythmias  Recent Labs: No results found for requested labs within last 365 days.   Lipid Panel No results found for: "CHOL", "TRIG", "HDL", "CHOLHDL", "VLDL", "LDLCALC", "LDLDIRECT"  Additional studies/ records that were reviewed today include:  Office visit notes from PCP    ASSESSMENT:    1. Dizziness   2. Palpitations      PLAN:  In order of problems listed above:  Dizziness -No formal diagnosis of POTS disease in the past -Orthostatic blood pressures in the office today was normal  although BP is on the soft side -Check 2D echo to assess for structural heart disease -Encouraged her to try to drink at least 64-80oz of fluid daily -liberalize Na in the diet -Check 8am cortisol level and TSH -check urine for SG -recommend to wear compression hose when she knows she is going to have to stand for a long period of time  Followup with me in 6 weeks  Time Spent: 20 minutes total time of encounter, including 15 minutes spent in face-to-face patient care on the date of this encounter. This time includes coordination of care and counseling regarding above mentioned problem list. Remainder of non-face-to-face time involved reviewing chart documents/testing relevant to the patient encounter and documentation in the medical record. I have independently reviewed documentation from referring provider  Medication Adjustments/Labs and Tests Ordered: Current medicines are reviewed at length with the patient today.  Concerns regarding medicines are outlined above.  Medication changes, Labs and Tests ordered today are listed in the Patient Instructions below.  There are no Patient Instructions on file for this visit.   Signed, 01/04/20, MD  12/24/2021 4:11 PM    [facility] [address] [place], [place], [place]  [place] Phone: [phone]; Fax: [phone]

## 2021-12-24 NOTE — Patient Instructions (Addendum)
Make sure you are drinking at least 64-80 ounces of fluids daily and liberalize your salt intake.   Medication Instructions:  Your physician recommends that you continue on your current medications as directed. Please refer to the Current Medication list given to you today.  *If you need a refill on your cardiac medications before your next appointment, please call your pharmacy*  Lab Work: AM Cortisol and Urinalysis - At [facility]  If you have labs (blood work) drawn today and your tests are completely normal, you will receive your results only by: MyChart Message (if you have MyChart) OR A paper copy in the mail If you have any lab test that is abnormal or we need to change your treatment, we will call you to review the results.  Testing/Procedures: Your physician has requested that you have an echocardiogram. Echocardiography is a painless test that uses sound waves to create images of your heart. It provides your doctor with information about the size and shape of your heart and how well your heart's chambers and valves are working. This procedure takes approximately one hour. There are no restrictions for this procedure.  Follow-Up: At [facility], you and your health needs are our priority.  As part of our continuing mission to provide you with exceptional heart care, we have created designated Provider Care Teams.  These Care Teams include your primary Cardiologist (physician) and Advanced Practice Providers (APPs -  Physician Assistants and Nurse Practitioners) who all work together to provide you with the care you need, when you need it.  Your next appointment:   6-8 week(s)  The format for your next appointment:   In Person  Provider:   Armanda Magic, MD     Important Information About Sugar

## 2021-12-24 NOTE — Addendum Note (Signed)
Addended by: Theresia Majors on: 12/24/2021 04:16 PM   Modules accepted: Orders

## 2022-01-03 ENCOUNTER — Other Ambulatory Visit: Payer: Self-pay

## 2022-01-03 DIAGNOSIS — R002 Palpitations: Secondary | ICD-10-CM

## 2022-01-03 DIAGNOSIS — R42 Dizziness and giddiness: Secondary | ICD-10-CM

## 2022-01-04 LAB — URINALYSIS
Bilirubin, UA: NEGATIVE
Glucose, UA: NEGATIVE
Ketones, UA: NEGATIVE
Nitrite, UA: NEGATIVE
Protein,UA: NEGATIVE
RBC, UA: NEGATIVE
Specific Gravity, UA: 1.021 (ref 1.005–1.030)
Urobilinogen, Ur: 0.2 mg/dL (ref 0.2–1.0)
pH, UA: 6 (ref 5.0–7.5)

## 2022-01-04 LAB — CORTISOL-AM, BLOOD: Cortisol - AM: 8.9 ug/dL (ref 6.2–19.4)

## 2022-01-07 ENCOUNTER — Telehealth: Payer: Self-pay | Admitting: Cardiology

## 2022-01-07 NOTE — Telephone Encounter (Signed)
° °  Pt is returning call to get lab result °

## 2022-01-07 NOTE — Telephone Encounter (Signed)
-----   Message from Quintella Reichert, MD sent at 01/07/2022  1:25 PM EDT ----- Urine specific gravity shows normal hydration status.  UA does show cloudy urine and + leukocytes - please get her in with PCP to recheck UA

## 2022-01-07 NOTE — Telephone Encounter (Signed)
The patient has been notified of the result and verbalized understanding.  All questions (if any) were answered. Theresia Majors, RN 01/07/2022 4:41 PM   Patient goes to [facility] for PCP.  Called and got their fax number [phone]. Will froward results.

## 2022-01-10 ENCOUNTER — Ambulatory Visit (HOSPITAL_COMMUNITY): Payer: Medicaid Other | Attending: Cardiovascular Disease

## 2022-01-10 DIAGNOSIS — R002 Palpitations: Secondary | ICD-10-CM | POA: Diagnosis present

## 2022-01-10 DIAGNOSIS — R42 Dizziness and giddiness: Secondary | ICD-10-CM | POA: Diagnosis present

## 2022-01-10 LAB — ECHOCARDIOGRAM COMPLETE
AR max vel: 3.88 cm2
AV Area VTI: 3.45 cm2
AV Area mean vel: 3.64 cm2
AV Mean grad: 3 mmHg
AV Peak grad: 5.8 mmHg
Ao pk vel: 1.2 m/s
Area-P 1/2: 2.45 cm2
S' Lateral: 3 cm

## 2022-01-17 ENCOUNTER — Ambulatory Visit: Payer: Medicaid Other | Admitting: Allergy

## 2022-02-08 ENCOUNTER — Encounter: Payer: Self-pay | Admitting: Physician Assistant

## 2022-02-08 ENCOUNTER — Ambulatory Visit: Payer: Medicaid Other | Admitting: Cardiology

## 2022-02-08 ENCOUNTER — Ambulatory Visit: Payer: Medicaid Other | Attending: Cardiology | Admitting: Physician Assistant

## 2022-02-08 VITALS — BP 108/62 | HR 72 | Ht 67.0 in | Wt 170.0 lb

## 2022-02-08 DIAGNOSIS — R42 Dizziness and giddiness: Secondary | ICD-10-CM

## 2022-02-08 NOTE — Patient Instructions (Signed)
Medication Instructions:  Your physician recommends that you continue on your current medications as directed. Please refer to the Current Medication list given to you today.  *If you need a refill on your cardiac medications before your next appointment, please call your pharmacy*   Lab Work: None If you have labs (blood work) drawn today and your tests are completely normal, you will receive your results only by: MyChart Message (if you have MyChart) OR A paper copy in the mail If you have any lab test that is abnormal or we need to change your treatment, we will call you to review the results.   Follow-Up: At [facility], you and your health needs are our priority.  As part of our continuing mission to provide you with exceptional heart care, we have created designated Provider Care Teams.  These Care Teams include your primary Cardiologist (physician) and Advanced Practice Providers (APPs -  Physician Assistants and Nurse Practitioners) who all work together to provide you with the care you need, when you need it.   Your next appointment:   6 month(s)  The format for your next appointment:   In Person  Provider:   Armanda Magic, MD

## 2022-02-08 NOTE — Progress Notes (Signed)
Cardiology Office Note:    Date:  02/08/2022   ID:  Sara Cantu, DOB 05-26-1986, MRN [id number]  PCP:  Patient, No Pcp Per  [facility] Cardiologist:  Fransico Him, MD     Referring MD: No ref. provider found   Chief Complaint:  No chief complaint on file.     History of Present Illness:   Sara Cantu is a 36 y.o. female with a history of asthma who was referred to Dr. Radford Pax 12/24/21 from her PCP due to  dizziness. She has had problems with intermittent dizziness and palpitations since her teen years but has never had a cardiac work-up.    She has had issues with syncope when she was young and once she realized the symptoms before her syncope she could terminate it before it happened.  She wasn't orthostatic although BP was soft. Urine SG was normal. Compression hose, liberlize salt. Increase fluid 64-80 ounces daily. Echo normal. AM cortisol normal.  Patient comes in for f/u. She has noticed improvement with the water increase and salt.  She becomes dizzy if she stands up quickly but she can control it and it's not as bad and doesn't last as long. Exercises regularly walk/run, kettle bells, yoga 4 days/week.         Past Medical History:  Diagnosis Date   Asthma    exercise induced when young   Complication of anesthesia    woke up before surgery completed, and remedicated and numb from neck down x 2 hours   Current Medications: No outpatient medications have been marked as taking for the 02/08/22 encounter (Office Visit) with Imogene Burn, PA-C.    Allergies:   Shellfish allergy   Social History   Tobacco Use   Smoking status: Never   Smokeless tobacco: Never  Vaping Use   Vaping Use: Never used  Substance Use Topics   Alcohol use: Never   Drug use: Never    Family Hx: The patient's family history includes Asthma in her mother; Diabetes Mellitus I in her brother; Skin cancer in her father.  ROS   EKGs/Labs/Other Test Reviewed:    EKG:   EKG is  not ordered today.    Recent Labs: No results found for requested labs within last 365 days.   Recent Lipid Panel No results for input(s): "CHOL", "TRIG", "HDL", "VLDL", "LDLCALC", "LDLDIRECT" in the last 8760 hours.   Prior CV Studies: ECHO COMPLETE WO IMAGING ENHANCING AGENT 01/10/2022  Narrative ECHOCARDIOGRAM REPORT    Patient Name:   Sara Cantu Date of Exam: 01/10/2022 Medical Rec #:  [id number]       Height:       67.0 in Accession #:    [id number]      Weight:       169.0 lb Date of Birth:  05-Jun-1986       BSA:          1.883 m Patient Age:    35 years        BP:           101/62 mmHg Patient Gender: F               HR:           75 bpm. Exam Location:  [facility]  Procedure: 2D Echo, Cardiac Doppler and Color Doppler  Indications:    R42 Dizziness  History:        Patient has no prior history of Echocardiogram examinations.  Signs/Symptoms:Dizziness/Lightheadedness. Orthostatic Hypotension.  Sonographer:    Farrel Conners RDCS Referring Phys: Cornelious Bryant TURNER  IMPRESSIONS   1. Left ventricular ejection fraction, by estimation, is 60 to 65%. The left ventricle has normal function. The left ventricle has no regional wall motion abnormalities. Left ventricular diastolic parameters were normal. 2. Right ventricular systolic function is normal. The right ventricular size is normal. There is normal pulmonary artery systolic pressure. The estimated right ventricular systolic pressure is 19.6 mmHg. 3. The mitral valve is grossly normal. Trivial mitral valve regurgitation. No evidence of mitral stenosis. 4. The aortic valve is tricuspid. Aortic valve regurgitation is not visualized. No aortic stenosis is present. 5. The inferior vena cava is normal in size with greater than 50% respiratory variability, suggesting right atrial pressure of 3 mmHg.  Conclusion(s)/Recommendation(s): Normal biventricular function without evidence of hemodynamically significant  valvular heart disease.  FINDINGS Left Ventricle: Left ventricular ejection fraction, by estimation, is 60 to 65%. The left ventricle has normal function. The left ventricle has no regional wall motion abnormalities. The left ventricular internal cavity size was normal in size. There is no left ventricular hypertrophy. Left ventricular diastolic parameters were normal.  Right Ventricle: The right ventricular size is normal. No increase in right ventricular wall thickness. Right ventricular systolic function is normal. There is normal pulmonary artery systolic pressure. The tricuspid regurgitant velocity is 2.04 m/s, and with an assumed right atrial pressure of 3 mmHg, the estimated right ventricular systolic pressure is 19.6 mmHg.  Left Atrium: Left atrial size was normal in size.  Right Atrium: Right atrial size was normal in size.  Pericardium: There is no evidence of pericardial effusion.  Mitral Valve: The mitral valve is grossly normal. Trivial mitral valve regurgitation. No evidence of mitral valve stenosis.  Tricuspid Valve: The tricuspid valve is grossly normal. Tricuspid valve regurgitation is trivial. No evidence of tricuspid stenosis.  Aortic Valve: The aortic valve is tricuspid. Aortic valve regurgitation is not visualized. No aortic stenosis is present. Aortic valve mean gradient measures 3.0 mmHg. Aortic valve peak gradient measures 5.8 mmHg. Aortic valve area, by VTI measures 3.45 cm.  Pulmonic Valve: The pulmonic valve was grossly normal. Pulmonic valve regurgitation is trivial. No evidence of pulmonic stenosis.  Aorta: The aortic root and ascending aorta are structurally normal, with no evidence of dilitation.  Venous: The inferior vena cava is normal in size with greater than 50% respiratory variability, suggesting right atrial pressure of 3 mmHg.  IAS/Shunts: The atrial septum is grossly normal.   LEFT VENTRICLE PLAX 2D LVIDd:         4.40 cm   Diastology LVIDs:          3.00 cm   LV e' medial:    13.10 cm/s LV PW:         0.70 cm   LV E/e' medial:  5.9 LV IVS:        0.80 cm   LV e' lateral:   17.60 cm/s LVOT diam:     2.50 cm   LV E/e' lateral: 4.4 LV SV:         94 LV SV Index:   50 LVOT Area:     4.91 cm   RIGHT VENTRICLE RV Basal diam:  3.70 cm RV S prime:     13.80 cm/s TAPSE (M-mode): 2.6 cm  LEFT ATRIUM             Index        RIGHT ATRIUM  Index LA diam:        3.40 cm 1.81 cm/m   RA Area:     20.40 cm LA Vol (A2C):   54.4 ml 28.90 ml/m  RA Volume:   59.00 ml  31.34 ml/m LA Vol (A4C):   55.5 ml 29.48 ml/m LA Biplane Vol: 55.8 ml 29.64 ml/m AORTIC VALVE AV Area (Vmax):    3.88 cm AV Area (Vmean):   3.64 cm AV Area (VTI):     3.45 cm AV Vmax:           120.00 cm/s AV Vmean:          84.200 cm/s AV VTI:            0.272 m AV Peak Grad:      5.8 mmHg AV Mean Grad:      3.0 mmHg LVOT Vmax:         94.80 cm/s LVOT Vmean:        62.433 cm/s LVOT VTI:          0.191 m LVOT/AV VTI ratio: 0.70  AORTA Ao Root diam: 3.10 cm Ao Asc diam:  3.10 cm  MITRAL VALVE               TRICUSPID VALVE MV Area (PHT): cm         TR Peak grad:   16.6 mmHg MV Decel Time: 310 msec    TR Vmax:        204.00 cm/s MV E velocity: 76.75 cm/s MV A velocity: 41.70 cm/s  SHUNTS MV E/A ratio:  1.84        Systemic VTI:  0.19 m Systemic Diam: 2.50 cm  Lennie Odor MD Electronically signed by Lennie Odor MD Signature Date/Time: 01/10/2022/4:49:02 PM    Final         Risk Assessment/Calculations/Metrics:              Physical Exam:    VS:  BP 108/62   Pulse 72   Ht 5\' 7"  (1.702 m)   Wt 170 lb (77.1 kg)   SpO2 98%   Breastfeeding Unknown   BMI 26.63 kg/m     Wt Readings from Last 3 Encounters:  02/08/22 170 lb (77.1 kg)  12/24/21 169 lb (76.7 kg)  01/07/20 200 lb 12.8 oz (91.1 kg)      GEN: Well nourished, well developed, in no acute distress  Neck: no JVD, carotid bruits, or masses Cardiac: RRR; no murmurs,  rubs, or gallops  Respiratory:  clear to auscultation bilaterally, normal work of breathing GI: soft, nontender, nondistended, + BS Ext: without cyanosis, clubbing, or edema, Good distal pulses bilaterally Neuro:  Alert and Oriented x 3,  Psych: euthymic mood, full affect   ASSESSMENT & PLAN:   No problem-specific Assessment & Plan notes found for this encounter.       Dizziness has improved with drinking extra water and liberalizing salt. Echo normal, am cortisol normal.  Continue current management.       Dispo:  No follow-ups on file.   Medication Adjustments/Labs and Tests Ordered: Current medicines are reviewed at length with the patient today.  Concerns regarding medicines are outlined above.  Tests Ordered: No orders of the defined types were placed in this encounter.  Medication Changes: No orders of the defined types were placed in this encounter.  Signed, 03/08/20, PA-C  02/08/2022 1:38 PM    [facility] [address] [place], [place], [place]  [place]  Phone: [phone]; Fax: [phone]

## 2022-02-28 ENCOUNTER — Ambulatory Visit: Payer: Medicaid Other | Admitting: Internal Medicine

## 2022-03-01 ENCOUNTER — Ambulatory Visit: Payer: Medicaid Other | Admitting: Allergy

## 2022-03-01 ENCOUNTER — Encounter: Payer: Self-pay | Admitting: Allergy

## 2022-03-01 VITALS — BP 120/70 | HR 70 | Temp 97.8°F | Resp 16 | Ht 68.0 in | Wt 169.0 lb

## 2022-03-01 DIAGNOSIS — T781XXD Other adverse food reactions, not elsewhere classified, subsequent encounter: Secondary | ICD-10-CM

## 2022-03-01 DIAGNOSIS — T781XXA Other adverse food reactions, not elsewhere classified, initial encounter: Secondary | ICD-10-CM | POA: Diagnosis not present

## 2022-03-01 DIAGNOSIS — J3089 Other allergic rhinitis: Secondary | ICD-10-CM | POA: Diagnosis not present

## 2022-03-01 DIAGNOSIS — J4520 Mild intermittent asthma, uncomplicated: Secondary | ICD-10-CM | POA: Diagnosis not present

## 2022-03-01 MED ORDER — EPINEPHRINE 0.3 MG/0.3ML IJ SOAJ: 0.3000 mg | INTRAMUSCULAR | 1 refills | Status: DC | PRN

## 2022-03-01 MED ORDER — VENTOLIN HFA 108 (90 BASE) MCG/ACT IN AERS: 2.0000 | INHALATION_SPRAY | RESPIRATORY_TRACT | 1 refills | Status: AC | PRN

## 2022-03-01 NOTE — Assessment & Plan Note (Signed)
Perennial rhinoconjunctivitis symptoms.  Pollen and dust are triggers.  Take Zyrtec as needed.  No prior testing.  Today's skin testing showed: Positive to grass, ragweed, trees, cat, dog, cockroach, dust mites.  Borderline positive to weed pollen.  Start environmental control measures as below.  Use over the counter antihistamines such as Zyrtec (cetirizine), Claritin (loratadine), Allegra (fexofenadine), or Xyzal (levocetirizine) daily as needed. May take twice a day during allergy flares. May switch antihistamines every few months.  Consider allergy injections for long term control if above medications do not help the symptoms - handout given.  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   Let us know when ready to start.

## 2022-03-01 NOTE — Progress Notes (Signed)
New Patient Note  RE: Sara Cantu MRN: [id number] DOB: [birth year] Date of Office Visit: 03/01/2022  Consult requested by: Remus Loffler, PA-C Primary care provider: Remus Loffler, PA-C  Chief Complaint: Food Intolerance and Asthma  History of Present Illness: I had the pleasure of seeing Sara Cantu for initial evaluation at the [facility] on 03/01/2022. She is a 36 y.o. female, who is referred here by Remus Loffler, PA-C for the evaluation of food allergy.  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 The reaction occurred about 10+ years ago, after she ate 2 pieces of shrimp. Symptoms started within minutes and was in the form of scratchy/itchy throat/ears, throat tightness, tongue swelling. Denies any hives, wheezing, abdominal pain, diarrhea, vomiting. Denies any associated cofactors such as exertion, infection, NSAID use, or alcohol consumption. The symptoms lasted for 3-4 hours. She was not evaluated in ED. Last shrimp exposure was about 9 years ago.  Within the past 2 years she noted that when she is eating out at restaurants she notices some perioral pruritus. She is not sure what was the trigger as she was not exposed to shrimp and apples.   Other triggers include fresh apples. Tolerates peeled apples and processed apples.   She does not have access to epinephrine autoinjector.   Past work up includes: none. Dietary History: patient has been eating other foods including milk, eggs, peanut, treenuts, sesame, fish (salmon, flounder), soy, wheat, meats, fruits and vegetables.  She reports reading labels and avoiding shellfish and fresh apples in diet completely.   Assessment and Plan: Maryum is a 36 y.o. female with: Other adverse food reactions, not elsewhere classified, subsequent encounter Reaction to shrimp x 2 over 10 years ago in the form of perioral/facial pruritus, throat tightness and tongue angioedema. Symptoms resolved with water  after 3-4 hours. Noted less intense symptoms for the past 2 years after eating at restaurants. Not aware of any shrimp contamination but concerned about new food allergies. No specific triggers identified. Today's skin testing showed: Borderline positive to shellfish only. Negative to other foods.  The major allergen in shellfish allergy is tropomyosin, a pan-allergen that is also found in house dust mites and cockroaches which can cause cross reactivity and cause oral allergy syndrome symptoms as well.  Start strict avoidance of shellfish - shrimp, crab, lobster.  Be careful with cross-contamination when eating fish at restaurants.  I have prescribed epinephrine injectable device and demonstrated proper use. For mild symptoms you can take over the counter antihistamines such as Benadryl and monitor symptoms closely. If symptoms worsen or if you have severe symptoms including breathing issues, throat closure, significant swelling, whole body hives, severe diarrhea and vomiting, lightheadedness then inject epinephrine and seek immediate medical care afterwards. Emergency action plan given. If you have another episode, write down what you have eaten and send a mychart message.  Oral allergy syndrome, subsequent encounter Perioral pruritus with fresh apples. Discussed that her food triggered oral and throat symptoms are likely caused by oral food allergy syndrome (OFAS). This is caused by cross reactivity of pollen with fresh fruits and vegetables, and nuts. Symptoms are usually localized in the form of itching and burning in mouth and throat. Very rarely it can progress to more severe symptoms. Eating foods in cooked or processed forms usually minimizes symptoms. I recommended avoidance of eating the problem foods, especially during the peak season(s). Sometimes, OFAS can induce severe throat swelling or even a systemic reaction;  with such instance, I advised them to report to a local ER. A list of common  pollens and food cross-reactivities was provided to the patient.   Other allergic rhinitis Perennial rhinoconjunctivitis symptoms.  Pollen and dust are triggers.  Take Zyrtec as needed.  No prior testing. Today's skin testing showed: Positive to grass, ragweed, trees, cat, dog, cockroach, dust mites.  Borderline positive to weed pollen. Start environmental control measures as below. Use over the counter antihistamines such as Zyrtec (cetirizine), Claritin (loratadine), Allegra (fexofenadine), or Xyzal (levocetirizine) daily as needed. May take twice a day during allergy flares. May switch antihistamines every few months. Consider allergy injections for long term control if above medications do not help the symptoms - handout given. 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  Let us know when ready to start.   Mild intermittent asthma without complication Mainly exercise induced as a teen but also had a daily inhaler at one time. Noted increased wheezing and chest tightness after exercising lately. Today's spirometry as normal. May use albuterol rescue inhaler 2 puffs every 4 to 6 hours as needed for shortness of breath, chest tightness, coughing, and wheezing. May use albuterol rescue inhaler 2 puffs 5 to 15 minutes prior to strenuous physical activities. Monitor frequency of use.   Return in about 6 months (around 08/30/2022).  Meds ordered this encounter  Medications   VENTOLIN HFA 108 (90 Base) MCG/ACT inhaler    Sig: Inhale 2 puffs into the lungs every 4 (four) hours as needed for wheezing or shortness of breath (coughing fits).    Dispense:  18 g    Refill:  1   EPINEPHrine 0.3 mg/0.3 mL IJ SOAJ injection    Sig: Inject 0.3 mg into the muscle as needed for anaphylaxis.    Dispense:  2 each    Refill:  1    May dispense generic/Mylan/Teva brand.   Lab Orders  No laboratory test(s) ordered today    Other allergy screening: Asthma: yes Patient had exercise induced  asthma as a teen and recently noted chest tightness and wheezing when exercising.  Currently does not have an inhaler. Used to have albuterol and daily inhaler.  No prednisone this year.  Rhino conjunctivitis: yes Perennial rhino conjunctivitis symptoms for a few days at a time.  Pollen and dust seems to trigger symptoms and trigger her asthma. Takes zyrtec prn with good benefit.   Medication allergy: no Hymenoptera allergy: no Urticaria: no Eczema:no History of recurrent infections suggestive of immunodeficency: no  Diagnostics: Spirometry:  Tracings reviewed. Her effort: Good reproducible efforts. FVC: 4.77L FEV1: 4.10L, 125% predicted FEV1/FVC ratio: 86% Interpretation: Spirometry consistent with normal pattern.  Please see scanned spirometry results for details.  Skin Testing: Environmental allergy panel and select foods. Borderline positive to shellfish only. Negative to rest of foods. Positive to grass, ragweed, trees, cat, dog, cockroach, dust mites.  Borderline positive to weed pollen. Results discussed with patient/family.  Airborne Adult Perc - 03/01/22 1433     Time Antigen Placed 1433    Allergen Manufacturer Waynette Buttery    Location Back    Number of Test 59    1. Control-Buffer 50% Glycerol Negative    2. Control-Histamine 1 mg/ml 2+    3. Albumin saline Negative    4. Bahia 3+    5. [place] 3+    6. Johnson 2+    7. Kentucky Blue 3+    8. Meadow Fescue 2+    9. Perennial Rye  4+    10. Sweet Vernal 3+    11. Timothy 3+    12. Cocklebur Negative    13. Burweed Marshelder Negative    14. Ragweed, short 2+    15. Ragweed, Giant Negative    16. Plantain,  English Negative    17. Lamb's Quarters Negative    18. Sheep Sorrell --   +/-   19. Rough Pigweed Negative    20. Marsh Elder, Rough Negative    21. Mugwort, Common Negative    22. Ash mix --   +/-   23. Birch mix 3+    24. Beech American 2+    25. Box, Elder Negative    26. Cedar, red Negative    27.  Cottonwood, Guinea-BissauEastern Negative    28. Elm mix Negative    29. Hickory 4+    30. Maple mix Negative    31. Oak, Guinea-Bissau[place]astern mix Negative    32. Pecan Pollen 2+    33. Pine mix Negative    34. Sycamore Eastern Negative    35. Walnut, Black Pollen --   +/-   36. Alternaria alternata Negative    37. Cladosporium Herbarum Negative    38. Aspergillus mix Negative    39. Penicillium mix Negative    40. Bipolaris sorokiniana (Helminthosporium) Negative    41. Drechslera spicifera (Curvularia) Negative    42. Mucor plumbeus Negative    43. Fusarium moniliforme Negative    44. Aureobasidium pullulans (pullulara) Negative    45. Rhizopus oryzae Negative    46. Botrytis cinera Negative    47. Epicoccum nigrum Negative    48. Phoma betae Negative    49. Candida Albicans Negative    50. Trichophyton mentagrophytes Negative    51. Mite, D Farinae  5,000 AU/ml Negative    52. Mite, D Pteronyssinus  5,000 AU/ml 4+    53. Cat Hair 10,000 BAU/ml Negative    54.  Dog Epithelia Negative    55. Mixed Feathers Negative    56. Horse Epithelia Negative    57. Cockroach, German Negative    58. Mouse Negative    59. Tobacco Leaf Negative             Intradermal - 03/01/22 1501     Time Antigen Placed 1500    Allergen Manufacturer Greer    Location Arm    Number of Test 8    Control Negative    Mold 1 Negative    Mold 2 Negative    Mold 3 Negative    Mold 4 Negative    Cat 2+    Dog 2+    Cockroach 2+             Food Adult Perc - 03/01/22 1400     Time Antigen Placed 1433    Allergen Manufacturer Waynette ButteryGreer    Location Back    Number of allergen test 72    1. Peanut Negative    2. Soybean Negative    3. Wheat Negative    4. Sesame Negative    5. Milk, cow Negative    6. Egg White, Chicken Negative    7. Casein Negative    8. Shellfish Mix --   +/-   9. Fish Mix Negative    10. Cashew Negative    11. Pecan Food Negative    12. Walnut Food Negative    13. Almond Negative    14.  Hazelnut Negative  15. [place] nut Negative    16. Coconut Negative    17. Pistachio Negative    18. Catfish Negative    19. Bass Negative    20. Trout Negative    21. Tuna Negative    22. Salmon Negative    23. Flounder Negative    24. Codfish Negative    25. Shrimp --   +/-   26. Crab --   +/-   27. Lobster --   +/-   28. Oyster Negative    29. Scallops Negative    30. Barley Negative    31. Oat  Negative    32. Rye  Negative    33. Hops Negative    34. Rice Negative    35. Cottonseed Negative    36. Saccharomyces Cerevisiae  Negative    37. Pork Negative    38. [place] Meat Negative    39. Chicken Meat Negative    40. Beef Negative    41. Lamb Negative    42. Tomato Negative    43. White Potato Negative    44. Sweet Potato Negative    45. Pea, Green/English Negative    46. Navy Bean Negative    47. Mushrooms Negative    48. Avocado Negative    49. Onion Negative    50. Cabbage Negative    51. Carrots Negative    52. Celery Negative    53. Corn Negative    54. Cucumber Negative    55. Grape (White seedless) Negative    56. Orange  Negative    57. Banana Negative    58. Apple Negative    59. Peach Negative    60. Strawberry Negative    61. Cantaloupe Negative    62. Watermelon Negative    63. Pineapple Negative    64. Chocolate/Cacao bean Negative    65. Karaya Gum Negative    66. Acacia (Arabic Gum) Negative    67. Cinnamon Negative    68. Nutmeg Negative    69. Ginger Negative    70. Garlic Negative    71. Pepper, black Negative    72. Mustard Negative             Past Medical History: Patient Active Problem List   Diagnosis Date Noted   Other adverse food reactions, not elsewhere classified, subsequent encounter 03/01/2022   Oral allergy syndrome, subsequent encounter 03/01/2022   Other allergic rhinitis 03/01/2022   Mild intermittent asthma without complication 03/01/2022   History of migraine 11/06/2019   HX of LGA baby 07/08/2019    Family history of congenital cardiac septal defect 12/22/2015   Past Medical History:  Diagnosis Date   Asthma    exercise induced when young   Complication of anesthesia    woke up before surgery completed, and remedicated and numb from neck down x 2 hours   Past Surgical History: Past Surgical History:  Procedure Laterality Date   TONSILLECTOMY     Medication List:  Current Outpatient Medications  Medication Sig Dispense Refill   cetirizine (ZYRTEC) 10 MG tablet Take 10 mg by mouth as needed for allergies.     EPINEPHrine 0.3 mg/0.3 mL IJ SOAJ injection Inject 0.3 mg into the muscle as needed for anaphylaxis. 2 each 1   VENTOLIN HFA 108 (90 Base) MCG/ACT inhaler Inhale 2 puffs into the lungs every 4 (four) hours as needed for wheezing or shortness of breath (coughing fits). 18 g 1   No current facility-administered  medications for this visit.   Allergies: Allergies  Allergen Reactions   Shellfish Allergy Swelling    Tongue swollen with eating shrimp   Social History: Social History   Socioeconomic History   Marital status: Married    Spouse name: Jaden Batchelder   Number of children: Not on file   Years of education: Not on file   Highest education level: Not on file  Occupational History   Not on file  Tobacco Use   Smoking status: Never   Smokeless tobacco: Never  Vaping Use   Vaping Use: Never used  Substance and Sexual Activity   Alcohol use: Never   Drug use: Never   Sexual activity: Yes    Birth control/protection: None  Other Topics Concern   Not on file  Social History Narrative   Not on file   Social Determinants of Health   Financial Resource Strain: Not on file  Food Insecurity: No Food Insecurity (01/02/2020)   Hunger Vital Sign    Worried About Running Out of Food in the Last Year: Never true    Ran Out of Food in the Last Year: Never true  Transportation Needs: No Transportation Needs (01/02/2020)   PRAPARE - Radiographer, therapeutic (Medical): No    Lack of Transportation (Non-Medical): No  Physical Activity: Not on file  Stress: Not on file  Social Connections: Not on file   Lives in a house. Smoking: denies Occupation: homemaker  Environmental HistoryFreight forwarder in the house: no Charity fundraiser in the family room: yes Carpet in the bedroom: yes Heating: electric Cooling: central Pet: no  Family History: Family History  Problem Relation Age of Onset   Asthma Mother    Skin cancer Father    Diabetes Mellitus I Brother    Review of Systems  Constitutional:  Negative for appetite change, chills, fever and unexpected weight change.  HENT:  Negative for congestion and rhinorrhea.   Eyes:  Negative for itching.  Respiratory:  Positive for chest tightness and wheezing. Negative for cough and shortness of breath.   Cardiovascular:  Negative for chest pain.  Gastrointestinal:  Negative for abdominal pain.  Genitourinary:  Negative for difficulty urinating.  Skin:  Negative for rash.  Allergic/Immunologic: Positive for environmental allergies and food allergies.  Neurological:  Negative for headaches.   Objective: BP 120/70   Pulse 70   Temp 97.8 F (36.6 C) (Temporal)   Resp 16   Ht 5\' 8"  (1.727 m)   Wt 169 lb (76.7 kg)   SpO2 99%   BMI 25.70 kg/m  Body mass index is 25.7 kg/m. Physical Exam Vitals and nursing note reviewed.  Constitutional:      Appearance: Normal appearance. She is well-developed.  HENT:     Head: Normocephalic and atraumatic.     Right Ear: Tympanic membrane and external ear normal.     Left Ear: Tympanic membrane and external ear normal.     Nose: Nose normal.     Mouth/Throat:     Mouth: Mucous membranes are moist.     Pharynx: Oropharynx is clear.  Eyes:     Conjunctiva/sclera: Conjunctivae normal.  Cardiovascular:     Rate and Rhythm: Normal rate and regular rhythm.     Heart sounds: Normal heart sounds. No murmur heard.    No friction rub. No  gallop.  Pulmonary:     Effort: Pulmonary effort is normal.     Breath sounds: Normal breath sounds. No  wheezing, rhonchi or rales.  Musculoskeletal:     Cervical back: Neck supple.  Skin:    General: Skin is warm.     Findings: No rash.  Neurological:     Mental Status: She is alert and oriented to person, place, and time.  Psychiatric:        Behavior: Behavior normal.   The plan was reviewed with the patient/family, and all questions/concerned were addressed.  It was my pleasure to see Angellica today and participate in her care. Please feel free to contact me with any questions or concerns.  Sincerely,  Wyline Mood, DO Allergy & Immunology  [facility] office: [phone] [facility] office: [phone]

## 2022-03-01 NOTE — Assessment & Plan Note (Addendum)
Reaction to shrimp x 2 over 10 years ago in the form of perioral/facial pruritus, throat tightness and tongue angioedema. Symptoms resolved with water after 3-4 hours. Noted less intense symptoms for the past 2 years after eating at restaurants. Not aware of any shrimp contamination but concerned about new food allergies. No specific triggers identified.  Today's skin testing showed: Borderline positive to shellfish only. Negative to other foods.   The major allergen in shellfish allergy is tropomyosin, a pan-allergen that is also found in house dust mites and cockroaches which can cause cross reactivity and cause oral allergy syndrome symptoms as well.   Start strict avoidance of shellfish - shrimp, crab, lobster.   Be careful with cross-contamination when eating fish at restaurants.   I have prescribed epinephrine injectable device and demonstrated proper use. For mild symptoms you can take over the counter antihistamines such as Benadryl and monitor symptoms closely. If symptoms worsen or if you have severe symptoms including breathing issues, throat closure, significant swelling, whole body hives, severe diarrhea and vomiting, lightheadedness then inject epinephrine and seek immediate medical care afterwards.  Emergency action plan given.  If you have another episode, write down what you have eaten and send a mychart message.

## 2022-03-01 NOTE — Assessment & Plan Note (Signed)
Perioral pruritus with fresh apples.  Discussed that her food triggered oral and throat symptoms are likely caused by oral food allergy syndrome (OFAS). This is caused by cross reactivity of pollen with fresh fruits and vegetables, and nuts. Symptoms are usually localized in the form of itching and burning in mouth and throat. Very rarely it can progress to more severe symptoms. Eating foods in cooked or processed forms usually minimizes symptoms. I recommended avoidance of eating the problem foods, especially during the peak season(s). Sometimes, OFAS can induce severe throat swelling or even a systemic reaction; with such instance, I advised them to report to a local ER. A list of common pollens and food cross-reactivities was provided to the patient.

## 2022-03-01 NOTE — Assessment & Plan Note (Signed)
Mainly exercise induced as a teen but also had a daily inhaler at one time. Noted increased wheezing and chest tightness after exercising lately.  Today's spirometry as normal. . May use albuterol rescue inhaler 2 puffs every 4 to 6 hours as needed for shortness of breath, chest tightness, coughing, and wheezing. May use albuterol rescue inhaler 2 puffs 5 to 15 minutes prior to strenuous physical activities. Monitor frequency of use.

## 2022-03-01 NOTE — Patient Instructions (Addendum)
Today's skin testing showed: Borderline positive to shellfish only. Negative to rest of foods. Positive to grass, ragweed, trees, cat, dog, cockroach, dust mites.  Borderline positive to weed pollen.  Results given.  Food allergies Start strict avoidance of shellfish - shrimp, crab, lobster.  Be careful with cross-contamination when eating fish at restaurants.  I have prescribed epinephrine injectable device and demonstrated proper use. For mild symptoms you can take over the counter antihistamines such as Benadryl and monitor symptoms closely. If symptoms worsen or if you have severe symptoms including breathing issues, throat closure, significant swelling, whole body hives, severe diarrhea and vomiting, lightheadedness then inject epinephrine and seek immediate medical care afterwards. Emergency action plan given.  If you have another episode, write down what you have eaten and send a mychart message.  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 This is caused by cross reactivity of pollen with fresh fruits and vegetables, and nuts. Symptoms are usually localized in the form of itching and burning in mouth and throat. Very rarely it can progress to more severe symptoms. Eating foods in cooked or processed forms usually minimizes symptoms. I recommended avoidance of eating the problem foods, especially during the peak season(s). Sometimes, OFAS can induce severe throat swelling or even a systemic reaction; with such instance, I advised them to report to a local ER. A list of common pollens and food cross-reactivities was provided to the patient.   Environmental allergies Start environmental control measures as below. Use over the counter antihistamines such as Zyrtec (cetirizine), Claritin (loratadine), Allegra (fexofenadine), or Xyzal (levocetirizine) daily as needed. May take twice a day during allergy flares.  May switch antihistamines every few months. Consider allergy injections for long term control if above medications do not help the symptoms - handout given. 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  Let us know when ready to start.   Asthma Normal breathing test today. May use albuterol rescue inhaler 2 puffs every 4 to 6 hours as needed for shortness of breath, chest tightness, coughing, and wheezing. May use albuterol rescue inhaler 2 puffs 5 to 15 minutes prior to strenuous physical activities. Monitor frequency of use.   Follow up in 6 months or sooner if needed.    Reducing Pollen Exposure Pollen seasons: trees (spring), grass (summer) and ragweed/weeds (fall). Keep windows closed in your home and car to lower pollen exposure.  Install air conditioning in the bedroom and throughout the house if possible.  Avoid going out in dry windy days - especially early morning. Pollen counts are highest between 5 - 10 AM and on dry, hot and windy days.  Save outside activities for late afternoon or after a heavy rain, when pollen levels are lower.  Avoid mowing of grass if you have grass pollen allergy. Be aware that pollen can also be transported indoors on people and pets.  Dry your clothes in an automatic dryer rather than hanging them outside where they might collect pollen.  Rinse hair and eyes before bedtime.  Control of House Dust Mite Allergen Dust mite allergens are a common trigger of allergy and asthma symptoms. While they can be found throughout the house, these microscopic creatures thrive in warm, humid environments such as bedding, upholstered furniture and carpeting. Because so much time is spent in the bedroom, it is essential to reduce mite levels there.  Encase pillows, mattresses, and box springs in special allergen-proof  fabric covers or airtight, zippered plastic covers.  Bedding should be washed weekly in hot water (130 F) and dried in a hot dryer.  Allergen-proof covers are available for comforters and pillows that can't be regularly washed.  Wash the allergy-proof covers every few months. Minimize clutter in the bedroom. Keep pets out of the bedroom.  Keep humidity less than 50% by using a dehumidifier or air conditioning. You can buy a humidity measuring device called a hygrometer to monitor this.  If possible, replace carpets with hardwood, linoleum, or washable area rugs. If that's not possible, vacuum frequently with a vacuum that has a HEPA filter. Remove all upholstered furniture and non-washable window drapes from the bedroom. Remove all non-washable stuffed toys from the bedroom.  Wash stuffed toys weekly.  Pet Allergen Avoidance: Contrary to popular opinion, there are no "hypoallergenic" breeds of dogs or cats. That is because people are not allergic to an animal's hair, but to an allergen found in the animal's saliva, dander (dead skin flakes) or urine. Pet allergy symptoms typically occur within minutes. For some people, symptoms can build up and become most severe 8 to 12 hours after contact with the animal. People with severe allergies can experience reactions in public places if dander has been transported on the pet owners' clothing. Keeping an animal outdoors is only a partial solution, since homes with pets in the yard still have higher concentrations of animal allergens. Before getting a pet, ask your allergist to determine if you are allergic to animals. If your pet is already considered part of your family, try to minimize contact and keep the pet out of the bedroom and other rooms where you spend a great deal of time. As with dust mites, vacuum carpets often or replace carpet with a hardwood floor, tile or linoleum. High-efficiency particulate air (HEPA) cleaners can reduce allergen levels over time. While dander and saliva are the source of cat and dog allergens, urine is the source of allergens from rabbits, hamsters, mice  and [place] pigs; so ask a non-allergic family member to clean the animal's cage. If you have a pet allergy, talk to your allergist about the potential for allergy immunotherapy (allergy shots). This strategy can often provide long-term relief.  Cockroach Allergen Avoidance Cockroaches are often found in the homes of densely populated urban areas, schools or commercial buildings, but these creatures can lurk almost anywhere. This does not mean that you have a dirty house or living area. Block all areas where roaches can enter the home. This includes crevices, wall cracks and windows.  Cockroaches need water to survive, so fix and seal all leaky faucets and pipes. Have an exterminator go through the house when your family and pets are gone to eliminate any remaining roaches. Keep food in lidded containers and put pet food dishes away after your pets are done eating. Vacuum and sweep the floor after meals, and take out garbage and recyclables. Use lidded garbage containers in the kitchen. Wash dishes immediately after use and clean under stoves, refrigerators or toasters where crumbs can accumulate. Wipe off the stove and other kitchen surfaces and cupboards regularly.

## 2022-03-21 ENCOUNTER — Other Ambulatory Visit: Payer: Self-pay | Admitting: Allergy

## 2022-08-26 ENCOUNTER — Ambulatory Visit: Payer: Medicaid Other | Admitting: Cardiology

## 2022-08-30 ENCOUNTER — Ambulatory Visit: Payer: Medicaid Other | Admitting: Allergy

## 2023-12-04 ENCOUNTER — Other Ambulatory Visit: Payer: Self-pay

## 2023-12-04 ENCOUNTER — Emergency Department (HOSPITAL_COMMUNITY)

## 2023-12-04 ENCOUNTER — Encounter (HOSPITAL_COMMUNITY): Payer: Self-pay

## 2023-12-04 ENCOUNTER — Emergency Department (HOSPITAL_COMMUNITY)
Admission: EM | Admit: 2023-12-04 | Discharge: 2023-12-04 | Disposition: A | Discharge status: Home / Self Care | Source: Ambulatory Visit | Attending: Emergency Medicine | Admitting: Emergency Medicine

## 2023-12-04 DIAGNOSIS — S8992XA Unspecified injury of left lower leg, initial encounter: Secondary | ICD-10-CM | POA: Diagnosis present

## 2023-12-04 DIAGNOSIS — W16132A Fall into natural body of water striking side causing other injury, initial encounter: Secondary | ICD-10-CM | POA: Diagnosis not present

## 2023-12-04 DIAGNOSIS — Z23 Encounter for immunization: Secondary | ICD-10-CM | POA: Diagnosis not present

## 2023-12-04 DIAGNOSIS — Y92828 Other wilderness area as the place of occurrence of the external cause: Secondary | ICD-10-CM | POA: Diagnosis not present

## 2023-12-04 DIAGNOSIS — S81012A Laceration without foreign body, left knee, initial encounter: Secondary | ICD-10-CM | POA: Insufficient documentation

## 2023-12-04 MED ORDER — CEPHALEXIN 500 MG PO CAPS: 500.0000 mg | ORAL_CAPSULE | Freq: Two times a day (BID) | ORAL | 0 refills | Status: AC

## 2023-12-04 MED ORDER — TETANUS-DIPHTH-ACELL PERTUSSIS 5-2.5-18.5 LF-MCG/0.5 IM SUSY
0.5000 mL | PREFILLED_SYRINGE | Freq: Once | INTRAMUSCULAR | Status: AC
  Administered 2023-12-04: 0.5 mL via INTRAMUSCULAR
  Filled 2023-12-04: qty 0.5

## 2023-12-04 NOTE — ED Triage Notes (Signed)
 [place]/ lac to left knee/ bleeding controlled/ no thinners/ [id number]

## 2023-12-04 NOTE — Discharge Instructions (Signed)
:   Laceration gently with soap and water twice a day.  Get staples out in 10 to 14 days.  Take Tylenol  or Motrin  for pain

## 2023-12-04 NOTE — ED Provider Notes (Signed)
 [place] EMERGENCY DEPARTMENT AT [facility] Provider Note   CSN: [id number] Arrival date & time: 12/04/23  1432     Patient presents with: Knee Injury   Sara Cantu is a 38 y.o. female.   Patient fell outside and hit her left knee.  She complains of a small laceration to the left knee  The history is provided by the patient. No language interpreter was used.  Fall This is a new problem. The current episode started 6 to 12 hours ago. The problem occurs rarely. The problem has been resolved. Pertinent negatives include no chest pain, no abdominal pain and no headaches. Nothing aggravates the symptoms. Nothing relieves the symptoms. She has tried nothing for the symptoms.       Prior to Admission medications   Medication Sig Start Date End Date Taking? Authorizing Provider  cephALEXin (KEFLEX) 500 MG capsule Take 1 capsule (500 mg total) by mouth 2 (two) times daily. 12/04/23  Yes Reatha Sur, MD  cetirizine (ZYRTEC) 10 MG tablet Take 10 mg by mouth as needed for allergies.    [provider]  EPIPEN  2-PAK 0.3 MG/0.3ML SOAJ injection INJECT 0.3 MG INTO THE MUSCLE AS NEEDED FOR ANAPHYLAXIS. 03/22/22   Luke Orlan HERO, DO  VENTOLIN  HFA 108 (90 Base) MCG/ACT inhaler Inhale 2 puffs into the lungs every 4 (four) hours as needed for wheezing or shortness of breath (coughing fits). 03/01/22   Luke Orlan HERO, DO    Allergies: Shellfish allergy     Review of Systems  Constitutional:  Negative for appetite change and fatigue.  HENT:  Negative for congestion, ear discharge and sinus pressure.   Eyes:  Negative for discharge.  Respiratory:  Negative for cough.   Cardiovascular:  Negative for chest pain.  Gastrointestinal:  Negative for abdominal pain and diarrhea.  Genitourinary:  Negative for frequency and hematuria.  Musculoskeletal:  Negative for back pain.       Left knee with pain  Skin:  Negative for rash.  Neurological:  Negative for seizures and headaches.   Psychiatric/Behavioral:  Negative for hallucinations.     Updated Vital Signs BP 99/70 (BP Location: Right Arm)   Pulse 84   Temp 98.5 F (36.9 C) (Oral)   Resp 18   Wt 78.9 kg   LMP 11/27/2023 (Exact Date)   SpO2 100%   BMI 26.46 kg/m   Physical Exam Vitals and nursing note reviewed.  Constitutional:      Appearance: She is well-developed.  HENT:     Head: Normocephalic.     Nose: Nose normal.   Eyes:     General: No scleral icterus.    Conjunctiva/sclera: Conjunctivae normal.   Neck:     Thyroid: No thyromegaly.   Cardiovascular:     Rate and Rhythm: Normal rate and regular rhythm.     Heart sounds: No murmur heard.    No friction rub. No gallop.  Pulmonary:     Breath sounds: No stridor. No wheezing or rales.  Chest:     Chest wall: No tenderness.  Abdominal:     General: There is no distension.     Tenderness: There is no abdominal tenderness. There is no rebound.   Musculoskeletal:        General: Normal range of motion.     Cervical back: Neck supple.     Comments: 1 cm laceration left knee  Lymphadenopathy:     Cervical: No cervical adenopathy.   Skin:    Findings:  No erythema or rash.   Neurological:     Mental Status: She is alert and oriented to person, place, and time.     Motor: No abnormal muscle tone.     Coordination: Coordination normal.   Psychiatric:        Behavior: Behavior normal.     (all labs ordered are listed, but only abnormal results are displayed) Labs Reviewed - No data to display  EKG: None  Radiology: DG Knee Complete 4 Views Left Result Date: 12/04/2023 CLINICAL DATA:  Trauma to the right knee. EXAM: LEFT KNEE - COMPLETE 4+ VIEW COMPARISON:  None Available. FINDINGS: No evidence of fracture, dislocation, or joint effusion. No evidence of arthropathy or other focal bone abnormality. Soft tissues are unremarkable. IMPRESSION: Negative. Electronically Signed   By: Vanetta Chou M.D.   On: 12/04/2023 15:14      .Laceration Repair  Date/Time: 12/04/2023 4:48 PM  Performed by: Suzette Pac, MD Authorized by: Suzette Pac, MD   Laceration details:    Length (cm):  1 (1 cm) Comments:     Patient has a 1 cm laceration to her left knee.  It was cleaned thoroughly with Betadine.  2 staples were used to close the laceration    Medications Ordered in the ED  Tdap (BOOSTRIX) injection 0.5 mL (has no administration in time range)                                    Medical Decision Making Risk Prescription drug management.   1 cm laceration left knee.  Laceration was closed with 2 staples and she will get them out in 10 to 14 days.  She is also put on Keflex for 5 days     Final diagnoses:  Laceration of left knee, initial encounter    ED Discharge Orders          Ordered    cephALEXin (KEFLEX) 500 MG capsule  2 times daily        12/04/23 1646               Suzette Pac, MD 12/04/23 1648
